# Patient Record
Sex: Male | Born: 1935 | Race: White | Hispanic: No | Marital: Single | Smoking: Never smoker
Health system: Southern US, Community
[De-identification: ages and names within clinical notes are randomized; demographics above are authoritative.]

## PROBLEM LIST (undated history)

## (undated) DIAGNOSIS — H5440 Blindness, one eye, unspecified eye: Secondary | ICD-10-CM

## (undated) DIAGNOSIS — J019 Acute sinusitis, unspecified: Secondary | ICD-10-CM

## (undated) DIAGNOSIS — F329 Major depressive disorder, single episode, unspecified: Secondary | ICD-10-CM

## (undated) DIAGNOSIS — R002 Palpitations: Secondary | ICD-10-CM

## (undated) DIAGNOSIS — I10 Essential (primary) hypertension: Secondary | ICD-10-CM

## (undated) DIAGNOSIS — R51 Headache: Secondary | ICD-10-CM

## (undated) DIAGNOSIS — D649 Anemia, unspecified: Secondary | ICD-10-CM

## (undated) DIAGNOSIS — Z85828 Personal history of other malignant neoplasm of skin: Secondary | ICD-10-CM

## (undated) DIAGNOSIS — C61 Malignant neoplasm of prostate: Secondary | ICD-10-CM

## (undated) DIAGNOSIS — K219 Gastro-esophageal reflux disease without esophagitis: Secondary | ICD-10-CM

## (undated) DIAGNOSIS — Z8546 Personal history of malignant neoplasm of prostate: Secondary | ICD-10-CM

## (undated) DIAGNOSIS — J309 Allergic rhinitis, unspecified: Secondary | ICD-10-CM

## (undated) DIAGNOSIS — R32 Unspecified urinary incontinence: Secondary | ICD-10-CM

## (undated) DIAGNOSIS — Z85118 Personal history of other malignant neoplasm of bronchus and lung: Secondary | ICD-10-CM

## (undated) DIAGNOSIS — K5730 Diverticulosis of large intestine without perforation or abscess without bleeding: Secondary | ICD-10-CM

## (undated) HISTORY — DX: Unspecified urinary incontinence: R32

## (undated) HISTORY — PX: TONSILLECTOMY: SHX5217

## (undated) HISTORY — DX: Gastro-esophageal reflux disease without esophagitis: K21.9

## (undated) HISTORY — DX: Headache: R51

## (undated) HISTORY — DX: Personal history of other malignant neoplasm of bronchus and lung: Z85.118

## (undated) HISTORY — DX: Anemia, unspecified: D64.9

## (undated) HISTORY — DX: Acute sinusitis, unspecified: J01.90

## (undated) HISTORY — DX: Diverticulosis of large intestine without perforation or abscess without bleeding: K57.30

## (undated) HISTORY — DX: Palpitations: R00.2

## (undated) HISTORY — DX: Allergic rhinitis, unspecified: J30.9

## (undated) HISTORY — DX: Personal history of other malignant neoplasm of skin: Z85.828

## (undated) HISTORY — DX: Blindness, one eye, unspecified eye: H54.40

## (undated) HISTORY — DX: Major depressive disorder, single episode, unspecified: F32.9

## (undated) HISTORY — DX: Essential (primary) hypertension: I10

## (undated) HISTORY — DX: Malignant neoplasm of prostate: C61

## (undated) HISTORY — DX: Personal history of malignant neoplasm of prostate: Z85.46

## (undated) HISTORY — PX: OTHER SURGICAL HISTORY: SHX169

---

## 2001-02-12 ENCOUNTER — Inpatient Hospital Stay (HOSPITAL_COMMUNITY): Admission: EM | Admit: 2001-02-12 | Discharge: 2001-02-13 | Payer: Self-pay | Admitting: Emergency Medicine

## 2001-02-12 ENCOUNTER — Encounter: Payer: Self-pay | Admitting: *Deleted

## 2001-02-13 ENCOUNTER — Encounter: Payer: Self-pay | Admitting: Cardiology

## 2001-12-16 ENCOUNTER — Encounter: Payer: Self-pay | Admitting: Emergency Medicine

## 2001-12-16 ENCOUNTER — Ambulatory Visit (HOSPITAL_COMMUNITY): Admission: RE | Admit: 2001-12-16 | Discharge: 2001-12-16 | Payer: Self-pay | Admitting: Emergency Medicine

## 2002-01-05 ENCOUNTER — Encounter: Payer: Self-pay | Admitting: Urology

## 2002-01-05 ENCOUNTER — Encounter: Admission: RE | Admit: 2002-01-05 | Discharge: 2002-01-05 | Payer: Self-pay | Admitting: Urology

## 2004-10-17 ENCOUNTER — Ambulatory Visit: Payer: Self-pay | Admitting: Internal Medicine

## 2006-08-11 ENCOUNTER — Ambulatory Visit: Payer: Self-pay | Admitting: Internal Medicine

## 2006-08-11 LAB — CONVERTED CEMR LAB
ALT: 15 units/L (ref 0–53)
AST: 22 units/L (ref 0–37)
Albumin: 4.2 g/dL (ref 3.5–5.2)
Alkaline Phosphatase: 51 units/L (ref 39–117)
BUN: 16 mg/dL (ref 6–23)
Basophils Absolute: 0 10*3/uL (ref 0.0–0.1)
Basophils Relative: 0.6 % (ref 0.0–1.0)
Bilirubin, Direct: 0.1 mg/dL (ref 0.0–0.3)
CO2: 30 meq/L (ref 19–32)
Calcium: 9.3 mg/dL (ref 8.4–10.5)
Chloride: 108 meq/L (ref 96–112)
Creatinine, Ser: 1.3 mg/dL (ref 0.4–1.5)
Eosinophils Absolute: 0.1 10*3/uL (ref 0.0–0.6)
Eosinophils Relative: 2.1 % (ref 0.0–5.0)
GFR calc Af Amer: 67 mL/min
GFR calc non Af Amer: 56 mL/min
Glucose, Bld: 104 mg/dL — ABNORMAL HIGH (ref 70–99)
HCT: 37.8 % — ABNORMAL LOW (ref 39.0–52.0)
Hemoglobin: 12.9 g/dL — ABNORMAL LOW (ref 13.0–17.0)
Lymphocytes Relative: 33 % (ref 12.0–46.0)
MCHC: 34 g/dL (ref 30.0–36.0)
MCV: 98 fL (ref 78.0–100.0)
Monocytes Absolute: 1 10*3/uL — ABNORMAL HIGH (ref 0.2–0.7)
Monocytes Relative: 15.6 % — ABNORMAL HIGH (ref 3.0–11.0)
Neutro Abs: 3 10*3/uL (ref 1.4–7.7)
Neutrophils Relative %: 48.7 % (ref 43.0–77.0)
PSA: 56.25 ng/mL — ABNORMAL HIGH (ref 0.10–4.00)
Platelets: 155 10*3/uL (ref 150–400)
Potassium: 5.1 meq/L (ref 3.5–5.1)
RBC: 3.86 M/uL — ABNORMAL LOW (ref 4.22–5.81)
RDW: 12.1 % (ref 11.5–14.6)
Sodium: 144 meq/L (ref 135–145)
TSH: 4.85 microintl units/mL (ref 0.35–5.50)
Total Bilirubin: 0.7 mg/dL (ref 0.3–1.2)
Total Protein: 6.8 g/dL (ref 6.0–8.3)
WBC: 6.1 10*3/uL (ref 4.5–10.5)

## 2006-08-12 DIAGNOSIS — I10 Essential (primary) hypertension: Secondary | ICD-10-CM | POA: Insufficient documentation

## 2006-08-12 DIAGNOSIS — J309 Allergic rhinitis, unspecified: Secondary | ICD-10-CM | POA: Insufficient documentation

## 2006-08-12 DIAGNOSIS — F329 Major depressive disorder, single episode, unspecified: Secondary | ICD-10-CM

## 2006-08-12 DIAGNOSIS — R51 Headache: Secondary | ICD-10-CM

## 2006-08-12 DIAGNOSIS — Z85118 Personal history of other malignant neoplasm of bronchus and lung: Secondary | ICD-10-CM

## 2006-08-12 DIAGNOSIS — R32 Unspecified urinary incontinence: Secondary | ICD-10-CM

## 2006-08-12 DIAGNOSIS — Z85828 Personal history of other malignant neoplasm of skin: Secondary | ICD-10-CM

## 2006-08-12 DIAGNOSIS — H5440 Blindness, one eye, unspecified eye: Secondary | ICD-10-CM

## 2006-08-12 DIAGNOSIS — F3289 Other specified depressive episodes: Secondary | ICD-10-CM

## 2006-08-12 DIAGNOSIS — R519 Headache, unspecified: Secondary | ICD-10-CM | POA: Insufficient documentation

## 2006-08-12 DIAGNOSIS — Z8546 Personal history of malignant neoplasm of prostate: Secondary | ICD-10-CM

## 2006-08-12 DIAGNOSIS — E785 Hyperlipidemia, unspecified: Secondary | ICD-10-CM

## 2006-08-12 DIAGNOSIS — K5730 Diverticulosis of large intestine without perforation or abscess without bleeding: Secondary | ICD-10-CM | POA: Insufficient documentation

## 2006-08-12 HISTORY — DX: Diverticulosis of large intestine without perforation or abscess without bleeding: K57.30

## 2006-08-12 HISTORY — DX: Other specified depressive episodes: F32.89

## 2006-08-12 HISTORY — DX: Allergic rhinitis, unspecified: J30.9

## 2006-08-12 HISTORY — DX: Personal history of other malignant neoplasm of skin: Z85.828

## 2006-08-12 HISTORY — DX: Personal history of malignant neoplasm of prostate: Z85.46

## 2006-08-12 HISTORY — DX: Major depressive disorder, single episode, unspecified: F32.9

## 2006-08-12 HISTORY — DX: Personal history of other malignant neoplasm of bronchus and lung: Z85.118

## 2006-08-12 HISTORY — DX: Essential (primary) hypertension: I10

## 2006-08-12 HISTORY — DX: Unspecified urinary incontinence: R32

## 2006-08-12 HISTORY — DX: Blindness, one eye, unspecified eye: H54.40

## 2006-08-12 HISTORY — DX: Headache: R51

## 2010-02-18 ENCOUNTER — Encounter: Payer: Self-pay | Admitting: Urology

## 2010-04-09 ENCOUNTER — Telehealth: Payer: Self-pay | Admitting: Internal Medicine

## 2010-04-10 ENCOUNTER — Other Ambulatory Visit: Payer: Self-pay | Admitting: Internal Medicine

## 2010-04-10 ENCOUNTER — Encounter: Payer: Self-pay | Admitting: Internal Medicine

## 2010-04-10 ENCOUNTER — Other Ambulatory Visit: Payer: Medicare PPO

## 2010-04-10 ENCOUNTER — Ambulatory Visit (INDEPENDENT_AMBULATORY_CARE_PROVIDER_SITE_OTHER): Payer: Medicare PPO | Admitting: Internal Medicine

## 2010-04-10 DIAGNOSIS — Z23 Encounter for immunization: Secondary | ICD-10-CM

## 2010-04-10 DIAGNOSIS — Z8546 Personal history of malignant neoplasm of prostate: Secondary | ICD-10-CM

## 2010-04-10 DIAGNOSIS — R002 Palpitations: Secondary | ICD-10-CM | POA: Insufficient documentation

## 2010-04-10 DIAGNOSIS — Z0000 Encounter for general adult medical examination without abnormal findings: Secondary | ICD-10-CM

## 2010-04-10 DIAGNOSIS — J019 Acute sinusitis, unspecified: Secondary | ICD-10-CM

## 2010-04-10 DIAGNOSIS — I10 Essential (primary) hypertension: Secondary | ICD-10-CM

## 2010-04-10 DIAGNOSIS — E785 Hyperlipidemia, unspecified: Secondary | ICD-10-CM

## 2010-04-10 DIAGNOSIS — K219 Gastro-esophageal reflux disease without esophagitis: Secondary | ICD-10-CM | POA: Insufficient documentation

## 2010-04-10 DIAGNOSIS — D649 Anemia, unspecified: Secondary | ICD-10-CM

## 2010-04-10 HISTORY — DX: Acute sinusitis, unspecified: J01.90

## 2010-04-10 HISTORY — DX: Palpitations: R00.2

## 2010-04-10 HISTORY — DX: Gastro-esophageal reflux disease without esophagitis: K21.9

## 2010-04-10 LAB — LIPID PANEL
LDL Cholesterol: 56 mg/dL (ref 0–99)
Total CHOL/HDL Ratio: 2
VLDL: 15 mg/dL (ref 0.0–40.0)

## 2010-04-10 LAB — CBC WITH DIFFERENTIAL/PLATELET
Basophils Absolute: 0 10*3/uL (ref 0.0–0.1)
Basophils Relative: 0.4 % (ref 0.0–3.0)
Eosinophils Absolute: 0.1 10*3/uL (ref 0.0–0.7)
Eosinophils Relative: 1.6 % (ref 0.0–5.0)
HCT: 33 % — ABNORMAL LOW (ref 39.0–52.0)
Hemoglobin: 11.6 g/dL — ABNORMAL LOW (ref 13.0–17.0)
Lymphocytes Relative: 26.7 % (ref 12.0–46.0)
Lymphs Abs: 1.6 10*3/uL (ref 0.7–4.0)
MCHC: 35 g/dL (ref 30.0–36.0)
MCV: 98.1 fl (ref 78.0–100.0)
Monocytes Absolute: 0.7 10*3/uL (ref 0.1–1.0)
Monocytes Relative: 11.7 % (ref 3.0–12.0)
Neutro Abs: 3.7 10*3/uL (ref 1.4–7.7)
Neutrophils Relative %: 59.6 % (ref 43.0–77.0)
Platelets: 138 10*3/uL — ABNORMAL LOW (ref 150.0–400.0)
RBC: 3.37 Mil/uL — ABNORMAL LOW (ref 4.22–5.81)
RDW: 14.1 % (ref 11.5–14.6)
WBC: 6.1 10*3/uL (ref 4.5–10.5)

## 2010-04-10 LAB — URINALYSIS
Bilirubin Urine: NEGATIVE
Hgb urine dipstick: NEGATIVE
Nitrite: NEGATIVE
Urobilinogen, UA: 0.2 (ref 0.0–1.0)

## 2010-04-10 LAB — BASIC METABOLIC PANEL
BUN: 24 mg/dL — ABNORMAL HIGH (ref 6–23)
CO2: 29 mEq/L (ref 19–32)
Calcium: 9.5 mg/dL (ref 8.4–10.5)
Chloride: 104 mEq/L (ref 96–112)
Creatinine, Ser: 1.6 mg/dL — ABNORMAL HIGH (ref 0.4–1.5)
GFR: 42.75 mL/min — ABNORMAL LOW (ref >60.00)
Glucose, Bld: 106 mg/dL — ABNORMAL HIGH (ref 70–99)
Potassium: 4.2 mEq/L (ref 3.5–5.1)
Sodium: 141 mEq/L (ref 135–145)

## 2010-04-10 LAB — HEPATIC FUNCTION PANEL
ALT: 15 U/L (ref 0–53)
AST: 20 U/L (ref 0–37)
Albumin: 4.2 g/dL (ref 3.5–5.2)
Alkaline Phosphatase: 71 U/L (ref 39–117)
Bilirubin, Direct: 0.1 mg/dL (ref 0.0–0.3)
Total Bilirubin: 0.6 mg/dL (ref 0.3–1.2)
Total Protein: 6.9 g/dL (ref 6.0–8.3)

## 2010-04-10 LAB — TSH: TSH: 6.02 u[IU]/mL — ABNORMAL HIGH (ref 0.35–5.50)

## 2010-04-10 LAB — PSA: PSA: >148 ng/mL — ABNORMAL HIGH (ref 0.10–4.00)

## 2010-04-11 ENCOUNTER — Encounter: Payer: Self-pay | Admitting: Internal Medicine

## 2010-04-11 DIAGNOSIS — C61 Malignant neoplasm of prostate: Secondary | ICD-10-CM | POA: Insufficient documentation

## 2010-04-11 HISTORY — DX: Malignant neoplasm of prostate: C61

## 2010-04-11 LAB — IBC PANEL: Iron: 76 ug/dL (ref 42–165)

## 2010-04-17 NOTE — Miscellaneous (Signed)
Summary: Orders Update   Clinical Lists Changes  Problems: Added new problem of PROSTATE CANCER, METASTATIC (ICD-185) Orders: Added new Referral order of Urology Referral (Urology) - Signed

## 2010-04-17 NOTE — Assessment & Plan Note (Signed)
Summary: kidney problem,heartburn/last ov 2008/cd   Vital Signs:  Patient profile:   77 year old male Height:      68 inches Weight:      132.25 pounds BMI:     20.18 O2 Sat:      97 % on Room air Temp:     98.3 degrees F oral Pulse rate:   60 / minute BP sitting:   162 / 90  (left arm) Cuff size:   regular  Vitals Entered By: Zella Ball Ewing CMA (AAMA) (April 10, 2010 2:08 PM)  O2 Flow:  Room air  Preventive Care Screening     declines tetanus and colonscopy  CC: Rash, over active bladder, labs and refills/RE   CC:  Rash, over active bladder, and labs and refills/RE.  History of Present Illness: here for welness and f/u after being lost to f/u for over 3 yrs;  incidetnly today with 3 dyas onset ST, facial pain, pressure, low grade temp and non prod cough;  Pt denies CP, worsening sob, doe, wheezing, orthopnea, pnd, worsening LE edema, palps, dizziness or syncope  Pt denies new neuro symptoms such as headache, facial or extremity weakness  Pt denies polydipsia, polyuria  Overall good compliance with meds, trying to follow low chol  diet, wt stable, little excercise however   No recent wt loss, night sweats, loss of appetite or other constitutional symptoms . Overall good compliance with meds, and good tolerability.  Denies worsening depressive symptoms, suicidal ideation, or panic, though ha had significant anxiety in the past , and has chronic mild low grade per pt.  Pt states good ability with ADL's, low fall risk, home safety reviewed and adequate, no significant change in hearing or vision, trying to follow lower chol diet, and occasionally active only with regular excercise.  Does have some trouble with some urinary oincontinence,  has known hx of prostate ca.  [employer] veteran - does not go to [place] recently.      Does have ongoing midl reflux worse recently in the past 2 mo;  no dysphagia, abd pain, vomiting or blood.   Preventive Screening-Counseling & Management  Alcohol-Tobacco  Smoking Status: never      Drug Use:  no.    Problems Prior to Update: 1)  Diverticulosis, Colon  (ICD-562.10) 2)  Blindness, Left Eye  (ICD-369.60) 3)  Skin Cancer, Hx of  (ICD-V10.83) 4)  Allergic Rhinitis  (ICD-477.9) 5)  Urinary Incontinence  (ICD-788.30) 6)  Hypertension  (ICD-401.9) 7)  Hyperlipidemia  (ICD-272.4) 8)  Headache  (ICD-784.0) 9)  Depression  (ICD-311) 10)  Prostate Cancer, Hx of  (ICD-V10.46) 11)  Lung Cancer, Hx of  (ICD-V10.11)  Medications Prior to Update: 1)  Metoprolol Tartrate 50 Mg  Tabs (Metoprolol Tartrate) .... Take 1/2 Two Times A Day  Need Appt For Addtional Refils 2)  Simvastatin 40 Mg  Tabs (Simvastatin) .... Take 1 By Mouth Once Daily Need Appt For Addtional Refills 3)  Enalapril Maleate 2.5 Mg  Tabs (Enalapril Maleate) .... Take 1 By Mouth Once Daily Need Follow-Up Appt For Addtional Refills  Current Medications (verified): 1)  Metoprolol Tartrate 50 Mg  Tabs (Metoprolol Tartrate) .... Take 1/2 Two Times A Day 2)  Simvastatin 40 Mg  Tabs (Simvastatin) .... Take 1 By Mouth Once Daily 3)  Enalapril Maleate 2.5 Mg  Tabs (Enalapril Maleate) .... Take 1 By Mouth Once Daily 4)  Amoxicillin 500 Mg Caps (Amoxicillin) .... 2 By Mouth Two Times A Day 5)  Omeprazole 20 Mg Cpdr (Omeprazole) .Marland Kitchen.. 1po Once Daily 6)  Citalopram Hydrobromide 10 Mg Tabs (Citalopram Hydrobromide) .Marland Kitchen.. 1po Once Daily  Allergies (verified): 1)  ! Sulfa  Past History:  Past Surgical History: Last updated: 08/12/2006 Skin Cancer removal- Shoulder Cataract extraction- right eye Tonsillectomy  Family History: Last updated: 04/10/2010 cancer - mother with lung cancer  Risk Factors: Smoking Status: never (04/10/2010)  Past Medical History: Lung cancer, hx of Prostate cancer, hx of Depression Headache Hyperlipidemia Hypertension Urinary incontinence Allergic rhinitis Skin cancer, hx of Blind in L eye Diverticulosis, colon palpitations GERD  Family  History: Reviewed history and no changes required. cancer - mother with lung cancer  Social History: Reviewed history and no changes required. 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  The patient denies anorexia, fever, vision loss, hoarseness, chest pain, syncope, dyspnea on exertion, peripheral edema, prolonged cough, headaches, hemoptysis, abdominal pain, melena, hematochezia, severe indigestion/heartburn, hematuria, muscle weakness, suspicious skin lesions, transient blindness, difficulty walking, unusual weight change, abnormal bleeding, enlarged lymph nodes, and angioedema.         all otherwise negative per pt -  sees urology for prostate cancer, last visit 6 mo, alo has ongoing hearing loss  Physical Exam  General:  alert and underweight appearing. , elderly , frail but not confused, but has some hearing loss Head:  normocephalic and atraumatic.   Eyes:  vision grossly intact, pupils equal, and pupils round.   Ears:  R ear normal.  , left tm mod erythema, sinus tender bilat Nose:  nasal dischargemucosal pallor and mucosal edema.   Mouth:  pharyngeal erythema and fair dentition.   Neck:  supple and no masses.   Lungs:  normal respiratory effort and normal breath sounds.   Heart:  normal rate and regular rhythm.   Abdomen:  soft, non-tender, and normal bowel sounds.   Msk:  no joint tenderness and no joint swelling.   Extremities:  no edema, no erythema  Neurologic:  strength normal in all extremities and gait normal though frail and elderly Skin:  color normal and no rashes.   Psych:  depressed affect and slightly anxious.     Impression & Recommendations:  Problem # 1:  Preventive Health Care (ICD-V70.0) Overall doing well, age appropriate education and counseling updated, referral for  preventive services and immunizations addressed, dietary counseling and smoking status adressed , most recent labs reviewed, ecg reviewed I have personally reviewed and have noted 1.The patient's medical and social history 2.Their use of alcohol, tobacco or illicit drugs 3.Their current medications and supplements 4. Functional ability including ADL's, fall risk, home safety risk, hearing & visual impairment  5.Diet and physical activities 6.Evidence for depression or mood disorders The patients weight, height, BMI  have been recorded in the chart I have made referrals, counseling and provided education to the patient based review of the above  Orders: EKG w/ Interpretation (93000) TLB-BMP (Basic Metabolic Panel-BMET) (80048-METABOL) TLB-CBC Platelet - w/Differential (85025-CBCD) TLB-Hepatic/Liver Function Pnl (80076-HEPATIC) TLB-Lipid Panel (80061-LIPID) TLB-TSH (Thyroid Stimulating Hormone) (84443-TSH) TLB-Udip ONLY (81003-UDIP) EKG w/ Interpretation (93000)  Problem # 2:  SINUSITIS- ACUTE-NOS (ICD-461.9)  His updated medication list for this problem includes:    Amoxicillin 500 Mg Caps (Amoxicillin) .Marland Kitchen... 2 by mouth two times a day treat as above, f/u  any worsening signs or symptoms   Instructed on treatment. Call if symptoms persist or worsen.   Problem # 3:  HYPERTENSION (ICD-401.9)  His updated medication list for this problem includes:    Metoprolol Tartrate 50 Mg Tabs (Metoprolol tartrate) .Marland Kitchen... Take 1/2 two times a day    Enalapril Maleate 2.5 Mg Tabs (Enalapril maleate) .Marland Kitchen... Take 1 by mouth once daily  BP today: 162/90  Labs Reviewed: K+: 5.1 (08/11/2006) Creat: : 1.3 (08/11/2006)    to re-start meds - treat as above, f/u any worsening signs or symptoms   Problem # 4:  HYPERLIPIDEMIA (ICD-272.4)  His updated medication list for this problem includes:    Simvastatin 40 Mg Tabs (Simvastatin) .Marland Kitchen... Take 1 by mouth once daily  Labs Reviewed: SGOT: 22 (08/11/2006)    SGPT: 15 (08/11/2006) to re-start med - treat as above, f/u any worsening signs or symptoms   Problem # 5:  DEPRESSION (ICD-311)  His updated medication list for this problem includes:    Citalopram Hydrobromide 10 Mg Tabs (Citalopram hydrobromide) .Marland Kitchen... 1po once daily  Orders: TLB-B12 + Folate Pnl (82746_82607-B12/FOL) treat as above, f/u any worsening signs or symptoms  - new med for him  Problem # 6:  PALPITATIONS, RECURRENT (ICD-785.1)  His updated medication list for this problem includes:    Metoprolol Tartrate 50 Mg Tabs (Metoprolol tartrate) .Marland Kitchen... Take 1/2 two times a day treat as above, f/u any worsening signs or symptoms (chronic, ongoing, but out of med for > 1 yr)  Avoid caffeine, chocolate, and stimulants. Stress reduction as well as medication options discussed.   Problem # 7:  GERD (ICD-530.81)  His updated medication list for this problem includes:    Omeprazole 20 Mg Cpdr (Omeprazole) .Marland Kitchen... 1po once daily  Labs Reviewed: Hgb: 12.9 (08/11/2006)   Hct: 37.8 (08/11/2006) treat as above, f/u any worsening signs or symptoms , also for antireflux precautions  Complete Medication List: 1)  Metoprolol Tartrate 50 Mg Tabs (Metoprolol tartrate) .... Take 1/2 two times a day 2)  Simvastatin 40 Mg Tabs (Simvastatin) .... Take 1 by mouth once daily 3)  Enalapril Maleate 2.5 Mg Tabs (Enalapril maleate) .... Take 1 by mouth once daily 4)  Amoxicillin 500 Mg Caps (Amoxicillin) .... 2 by mouth two times a day 5)  Omeprazole 20 Mg Cpdr (Omeprazole) .Marland Kitchen.. 1po once daily 6)  Citalopram Hydrobromide 10 Mg Tabs (Citalopram hydrobromide) .Marland Kitchen.. 1po once daily  Other Orders: TLB-PSA (Prostate Specific Antigen) (84153-PSA)  Patient Instructions: 1)  you had the pneumonia shot today 2)  Please take all new medications as prescribed 3)  Continue all previous medications as before this visit  4)  Please go to the Lab in the basement for your blood and/or urine tests today  5)  Please  call the number on the [facility] for results of your testing  6)  Please schedule a follow-up appointment in 1 months, or sooner if needed 7)  Please keep your regular followup with your urologist as you do Prescriptions: CITALOPRAM HYDROBROMIDE 10 MG TABS (CITALOPRAM HYDROBROMIDE) 1po once daily  #90 x 3   Entered and Authorized by:   Corwin Levins MD   Signed by:   Corwin Levins MD on 04/10/2010   Method used:   Electronically to        [facility] [phone]* (retail)       [address] [place], [place]  [address]  Ph: [phone] or [phone]       Fax: [phone]   RxID:   [id number] OMEPRAZOLE 20 MG CPDR (OMEPRAZOLE) 1po once daily  #90 x 3   Entered and Authorized by:   Corwin Levins MD   Signed by:   Corwin Levins MD on 04/10/2010   Method used:   Electronically to        [facility] [phone]* (retail)       [address] [place], [place]  [address]       Ph: [phone] or [phone]       Fax: [phone]   RxID:   [id number] METOPROLOL TARTRATE 50 MG  TABS (METOPROLOL TARTRATE) take 1/2 two times a day  #90 x 3   Entered and Authorized by:   Corwin Levins MD   Signed by:   Corwin Levins MD on 04/10/2010   Method used:   Electronically to        [facility] [phone]* (retail)       [address] [place], [place]  [address]       Ph: [phone] or [phone]       Fax: [phone]   RxID:   [id number] SIMVASTATIN 40 MG  TABS (SIMVASTATIN) take 1 by mouth once daily  #90 x 3   Entered and Authorized by:   Corwin Levins MD   Signed by:   Corwin Levins MD on 04/10/2010   Method used:   Electronically to        [facility] [phone]* (retail)       [address] [place], [place]  [address]       Ph: [phone] or [phone]       Fax: [phone]   RxID:   [id number] ENALAPRIL MALEATE 2.5 MG  TABS (ENALAPRIL MALEATE) take 1 by mouth once daily  #90 x 3   Entered and Authorized by:   Corwin Levins MD    Signed by:   Corwin Levins MD on 04/10/2010   Method used:   Electronically to        [facility] [phone]* (retail)       [address] [place], [place]  [address]       Ph: [phone] or [phone]       Fax: [phone]   RxID:   [id number] AMOXICILLIN 500 MG CAPS (AMOXICILLIN) 2 by mouth two times a day  #40 x 0   Entered and Authorized by:   Corwin Levins MD   Signed by:   Corwin Levins MD on 04/10/2010   Method used:   Electronically to        [facility] [phone]* (retail)       [address] [place], [place]  [address]       Ph: [phone] or [phone]       Fax: [phone]   RxID:   [id number] AMOXICILLIN 500 MG CAPS (AMOXICILLIN) 2 by mouth two times a day  #40 x 0   Entered and Authorized by:   Corwin Levins MD   Signed by:   Corwin Levins MD on 04/10/2010   Method used:  Print then Give to Patient   RxID:   [id number]    Orders Added: 1)  EKG w/ Interpretation [93000] 2)  TLB-BMP (Basic Metabolic Panel-BMET) [80048-METABOL] 3)  TLB-CBC Platelet - w/Differential [85025-CBCD] 4)  TLB-Hepatic/Liver Function Pnl [80076-HEPATIC] 5)  TLB-Lipid Panel [80061-LIPID] 6)  TLB-TSH (Thyroid Stimulating Hormone) [84443-TSH] 7)  TLB-Udip ONLY [81003-UDIP] 8)  TLB-PSA (Prostate Specific Antigen) [84153-PSA] 9)  TLB-B12 + Folate Pnl [82746_82607-B12/FOL] 10)  EKG w/ Interpretation [93000]  Appended Document: Immunization Entry      Immunizations Administered:  Pneumonia Vaccine:    Vaccine Type: Pneumovax    Site: right deltoid    Mfr: Merck    Dose: 0.5 ml    Route: IM    Given by: Zella Ball Ewing CMA (AAMA)    Exp. Date: 06/09/2011    Lot #: [id number]    VIS given: 01/02/09 version given April 10, 2010.

## 2010-04-17 NOTE — Progress Notes (Signed)
  Phone Note Call from Patient Call back at Home Phone [phone]   Caller: [phone] Call For: Dr Jonny Ruiz Summary of Call: Pt last seen 2008 compaining of frequent urination, and artery over heart feels sore. I put on Dr Jonny Ruiz schedule for [facility]. Pt requests work in appt today, we have no 30 min slot avail w/Dr Karne Ozga--please advise. Initial call taken by: Verdell Face,  April 09, 2010 9:23 AM  Follow-up for Phone Call        note  he is now a new pt (> 3 yrs since last seen)  he should go to urgent care or ER today/now with these symptoms Follow-up by: Corwin Levins MD,  April 09, 2010 12:11 PM  Additional Follow-up for Phone Call Additional follow up Details #1::        Pt advised of above but states that he is not having CP but the arteries near his heart are "sore because of the cholesterol". Pt declined to go to UC or ER but was advised to go if sxs worsen. Pt agreed. Additional Follow-up by: Margaret Pyle, CMA,  April 09, 2010 2:12 PM

## 2010-05-14 ENCOUNTER — Ambulatory Visit: Payer: Medicare PPO | Admitting: Internal Medicine

## 2010-05-15 ENCOUNTER — Other Ambulatory Visit: Payer: Self-pay

## 2010-05-15 MED ORDER — AZITHROMYCIN 250 MG PO TABS: ORAL_TABLET | ORAL | Status: AC

## 2010-05-15 NOTE — Telephone Encounter (Signed)
Pt advised of Rx and pharmacy 

## 2010-05-15 NOTE — Telephone Encounter (Signed)
Done per emr 

## 2010-05-15 NOTE — Telephone Encounter (Signed)
Pt called stating that he is still having ear pain. Pt is requesting an ABX

## 2010-06-15 NOTE — Discharge Summary (Signed)
[place]. [facility]  Patient:    Warren Christensen, Warren Christensen Visit Number: [id number] MRN: [id number]          Service Type: MED Location: 2000 2003 01 Attending Physician:  Rollene Rotunda Dictated by:   Lavella Hammock, P.A.-C. Admit Date:  02/12/2001 Disc. Date: 02/13/01   CC:         [facility], [address] [place], [place]   Referring Physician Discharge [place]  DATE OF BIRTH:  05-07-1918  PROCEDURES:  CT scan of the chest.  HOSPITAL COURSE:  [name] is an 77 year old male with no known history of coronary artery disease, who was seem in [facility] on the day of admission for chest pain.  His EKG was abnormal, and the patient was transported to [facility] for evaluation.  He described the pain as an ache in his upper left chest and at worst with a 5/10.  Normally he takes some Lopressor that was filled in 1995 and a Bufferin and walks and obtains relief but on the night before admission, he had pain all night, and he was 3/10 upon exam.  The pain is worse with certain movements.  He denies shortness of breath and diaphoresis, although he had some nausea the night before admission.  He has not seen a doctor in four years.  He has seen a physician for skin cancer removal on his right neck and cataract surgery but otherwise has had no medical evaluation.  He was admitted to rule out MI and for further evaluation.  His enzymes were negative for MI, and his chest x-ray had no acute change.  He had a D-dimer checked, and that was elevated at 1.92, so he had a CT scan of his chest.  The CT scan of his chest showed a nodular density in the lingular area that was most likely a scar, but follow-up CT in 3-4 months was recommended.  The next day, the patient was symptom-free.  He was ambulating without difficulty.  His platelets were slightly decreased at 116, but they had been normal upon initial evaluation, and it was felt that this was  secondary to heparin.  With no further chest pain and a negative CT, he was considered stable for discharge on February 13, 2001.  CHEST X-RAY:  No active cardiopulmonary disease radiographically apparent. Mild tortuosity of the thoracic aorta.  LABORATORY VALUES:  Hemoglobin 11.6, hematocrit 33.4, WBC 6.7, platelets 116.  Sodium 142, potassium 4.3, chloride 106, CO2 30, BUN 12, creatinine 1.2, glucose 84.  Serial CK-MB and troponin I negative for MI.  Total cholesterol 129, triglycerides 82, HDL 52, LDL 61.  TSH 4.526.  DISCHARGE INSTRUCTIONS: 1. No strenuous activity until seeing M.D. 2. He is to stick to a low fat diet. 3. He is to get a CBC at his office visit. 4. He is to get a follow-up CT of the chest in three months. 5. He is to get a stress test on February 19, 2001, at 10 a.m. and check in on    the [facility] on the second floor at 9:45. 6. He is not to eat or drink after midnight before the test, and he is not to    take his Toprol on February 19, 2001. 7. He to follow up with [facility] for primary care, and he is to    follow up with Dr. Antoine Poche on February 19, 2001, at 3:15 after the stress    test.  DISCHARGE MEDICATIONS: 1. Coated aspirin 325 mg q.d. 2. Toprol XL 25 mg q.d. 3. Enalapril 2.5 mg b.i.d. 4. Nitroglycerin 0.4 mg sublingual p.r.n. Dictated by:   Lavella Hammock, P.A.-C. Attending Physician:  Rollene Rotunda DD:  02/13/01 TD:  02/13/01 Job: [id number]

## 2010-06-15 NOTE — H&P (Signed)
[place]. [facility]  Patient:    Warren Christensen, Warren Christensen Visit Number: [id number] MRN: [id number]          Service Type: Attending:  Rollene Rotunda, M.D. [facility] Dictated by:   Rollene Rotunda, M.D. [facility] Adm. Date:  02/12/01                           History and Physical  PRIMARY:  None.  REFERRING:  [facility].  REASON FOR PRESENTATION:  Evaluate patient with chest pain and an abnormal EKG.  HISTORY OF PRESENT ILLNESS:  The patient is a pleasant 77 year old gentleman with no prior cardiac history.  He reports chest pain.  This has gone on and off for years, however, it has been more persistent in the last two weeks.  He describes left-sided discomfort under his left nipple.  Last night it was at its worst and was 7/10 in intensity.  He might have been slightly nauseated. He was also dizzy.  He has gotten this way before when he has been hypertensive.  He took Lopressor and Bufferin.  The pain when away at approximately 2 a.m. and he went to sleep.  However, when is awoke this a.m. he had continued discomfort.  He does not have radiation to his neck or to his arms.  This morning he was not having any diaphoresis, shortness of breath. He has no PND or orthopnea.  He does notice that the pain has been worse with certain movements such as twisting in a certain direction.  He described it as almost a toothache-type discomfort.  He did get here to the emergency room where his pain has not been relieved with IV nitroglycerin and heparin.  In [facility] he was noted to have an EKG with poor anterior R wave progression with no baseline with comparison and so is referred to [place].  PAST MEDICAL HISTORY:  Hypertension x years.  He has no history of diabetes or hyperlipidemia (although his lipid status is not really known).  PAST SURGICAL HISTORY:  Skin cancer removed from his right neck, cataract surgery, tonsillectomy.  ALLERGIES:   None.  MEDICATIONS: 1. Lopressor 50 mg q.d. (I think he takes this on a p.r.n. basis). 2. Niacin over-the-counter. 3. Pepto-Bismol.  SOCIAL HISTORY:  The patient lives alone.  He does not smoke.  He does not drink alcohol.  He is a retired Cytogeneticist.  He walks quite a bit.  FAMILY HISTORY:  Noncontributory for early coronary artery disease.  REVIEW OF SYSTEMS:  Positive for occasional dizzy spells which he describes with his hypertension.  Gastroesophageal reflux disease, mild trouble swallowing at times, frequent urination.  Otherwise, his review of systems is completely negative for all other systems.  PHYSICAL EXAMINATION:  GENERAL:  The patient is in no distress.  VITAL SIGNS:  Blood pressure 167/110, heart rate 58 and regular, afebrile.  HEENT: Unremarkable.  Pupils equal, round and reactive to light.  Fundi not visualized.  Oral mucosa unremarkable.  NECK:  No jugular venous distention.  Wave form within normal limits.  Carotid upstroke brisk and symmetric.  No bruits, thyromegaly.  LYMPHATICS:  No cervical, axillary, inguinal adenopathy.  LUNGS:  Clear to auscultation bilaterally.  BACK:  No costovertebral angle tenderness.  CHEST:  Unremarkable, no tenderness.  HEART:  PMI not displaced or sustained.  S1 and S2 within normal limits, no S3, positive S4, no murmurs.  ABDOMEN: Flat, positive bowel sounds.  Normal in frequency and pitch.  No bruits, rebound, guarding, midline pulse.  No mass or organomegaly.  SKIN:  No rashes, ___ .  EXTREMITIES:  Pulses 2+ throughout.  No edema, no bruits, no cyanosis, no clubbing.  NEURO:  Oriented to person, place and time, cranial nerves II through XII grossly intact, motor grossly intact.  CHEST X-RAY:  No acute disease.  EKG:  Sinus rhythm, rate 58, axis within normal limits, intervals within normal limits, poor anterior R wave progression V1 and V2, no old EKGs for comparison.  LABORATORY DATA:  Hemoglobin 13.3,  WBC 5.0, platelets 158, INR 1.1, CK 187, MB 1.7, troponin 0.07.  ASSESSMENT AND PLAN:  1. CHEST PAIN:  The patients chest pain is very atypical.  It was with movement. He has no objective evidence of ischemia.  He does have longstanding hypertension as a risk factor.  He has a really unknown lipid status.  At this point he will be admitted for rule out myocardial infarction.  I will plan an exercise Cardiolite to further screen for coronary disease. 2. HYPERTENSION:  The patients blood pressure is elevated.  He is taking the beta blocker.  The think he would tolerate a low dose of this (he is bradycardic).  In addition, I agree with an ACE inhibitor.  I would probably maximize these medicines before starting any other agent. 3. RISK FACTORS:  Will check a lipid. 4. OTHER:  I will screen with a D-dimer, although I have a very low suspicion for pulmonary embolus.  If his D-dimer is negative, I would not pursue further pulmonary embolus workup.  He has no real risk factors for this.  He has no evidence of chronic obstructive respiratory disease or tenderness. Dictated by:   Rollene Rotunda, M.D. [facility] Attending:  Rollene Rotunda, M.D. [facility] DD:  02/12/01 TD:  02/12/01 Job: [id number]

## 2010-11-15 ENCOUNTER — Emergency Department (HOSPITAL_COMMUNITY): Payer: Medicare PPO

## 2010-11-15 ENCOUNTER — Emergency Department (HOSPITAL_COMMUNITY)
Admission: EM | Admit: 2010-11-15 | Discharge: 2010-11-15 | Discharge status: Against Medical Advice | Payer: Medicare PPO | Attending: Emergency Medicine | Admitting: Emergency Medicine

## 2010-11-15 DIAGNOSIS — H543 Unqualified visual loss, both eyes: Secondary | ICD-10-CM | POA: Insufficient documentation

## 2010-11-15 DIAGNOSIS — Z79899 Other long term (current) drug therapy: Secondary | ICD-10-CM | POA: Insufficient documentation

## 2010-11-15 DIAGNOSIS — Z8546 Personal history of malignant neoplasm of prostate: Secondary | ICD-10-CM | POA: Insufficient documentation

## 2010-11-15 DIAGNOSIS — I10 Essential (primary) hypertension: Secondary | ICD-10-CM | POA: Insufficient documentation

## 2010-11-15 DIAGNOSIS — E785 Hyperlipidemia, unspecified: Secondary | ICD-10-CM | POA: Insufficient documentation

## 2010-11-15 DIAGNOSIS — F329 Major depressive disorder, single episode, unspecified: Secondary | ICD-10-CM | POA: Insufficient documentation

## 2010-11-15 DIAGNOSIS — R0789 Other chest pain: Secondary | ICD-10-CM | POA: Insufficient documentation

## 2010-11-15 DIAGNOSIS — K5730 Diverticulosis of large intestine without perforation or abscess without bleeding: Secondary | ICD-10-CM | POA: Insufficient documentation

## 2010-11-15 DIAGNOSIS — K219 Gastro-esophageal reflux disease without esophagitis: Secondary | ICD-10-CM | POA: Insufficient documentation

## 2010-11-15 DIAGNOSIS — F3289 Other specified depressive episodes: Secondary | ICD-10-CM | POA: Insufficient documentation

## 2010-11-15 DIAGNOSIS — Z85828 Personal history of other malignant neoplasm of skin: Secondary | ICD-10-CM | POA: Insufficient documentation

## 2010-11-15 LAB — COMPREHENSIVE METABOLIC PANEL
ALT: 16 U/L (ref 0–53)
AST: 25 U/L (ref 0–37)
Albumin: 3.5 g/dL (ref 3.5–5.2)
CO2: 26 mEq/L (ref 19–32)
Chloride: 104 mEq/L (ref 96–112)
Creatinine, Ser: 1.28 mg/dL (ref 0.50–1.35)
GFR calc non Af Amer: 48 mL/min — ABNORMAL LOW (ref >90)
Potassium: 4.5 mEq/L (ref 3.5–5.1)
Sodium: 138 mEq/L (ref 135–145)
Total Bilirubin: 0.2 mg/dL — ABNORMAL LOW (ref 0.3–1.2)

## 2010-11-15 LAB — CBC
Hemoglobin: 9.9 g/dL — ABNORMAL LOW (ref 13.0–17.0)
Platelets: 164 10*3/uL (ref 150–400)
RBC: 3.18 MIL/uL — ABNORMAL LOW (ref 4.22–5.81)
WBC: 5 10*3/uL (ref 4.0–10.5)

## 2010-11-15 LAB — DIFFERENTIAL
Basophils Absolute: 0 10*3/uL (ref 0.0–0.1)
Basophils Relative: 0 % (ref 0–1)
Eosinophils Absolute: 0.1 10*3/uL (ref 0.0–0.7)
Neutro Abs: 3.1 10*3/uL (ref 1.7–7.7)
Neutrophils Relative %: 61 % (ref 43–77)

## 2010-11-15 LAB — POCT I-STAT TROPONIN I

## 2010-11-15 LAB — URINALYSIS, ROUTINE W REFLEX MICROSCOPIC
Glucose, UA: NEGATIVE mg/dL
Ketones, ur: NEGATIVE mg/dL
Leukocytes, UA: NEGATIVE
Nitrite: NEGATIVE
Specific Gravity, Urine: 1.005 (ref 1.005–1.030)
pH: 6 (ref 5.0–8.0)

## 2010-11-15 LAB — D-DIMER, QUANTITATIVE: D-Dimer, Quant: 3.45 ug/mL-FEU — ABNORMAL HIGH (ref 0.00–0.48)

## 2010-11-15 MED ORDER — IOHEXOL 300 MG/ML  SOLN
80.0000 mL | Freq: Once | INTRAMUSCULAR | Status: AC | PRN
  Administered 2010-11-15: 80 mL via INTRAVENOUS

## 2010-11-16 ENCOUNTER — Encounter: Payer: Self-pay | Admitting: Internal Medicine

## 2010-11-16 ENCOUNTER — Ambulatory Visit: Payer: Medicare PPO | Admitting: Internal Medicine

## 2010-11-16 LAB — URINE CULTURE
Colony Count: NO GROWTH
Culture  Setup Time: 201210181506
Culture: NO GROWTH

## 2010-11-17 ENCOUNTER — Encounter: Payer: Self-pay | Admitting: Internal Medicine

## 2010-11-17 DIAGNOSIS — Z0000 Encounter for general adult medical examination without abnormal findings: Secondary | ICD-10-CM | POA: Insufficient documentation

## 2010-11-19 ENCOUNTER — Ambulatory Visit: Payer: Medicare PPO | Admitting: Internal Medicine

## 2010-11-20 ENCOUNTER — Other Ambulatory Visit (INDEPENDENT_AMBULATORY_CARE_PROVIDER_SITE_OTHER): Payer: Medicare PPO

## 2010-11-20 ENCOUNTER — Encounter: Payer: Self-pay | Admitting: Internal Medicine

## 2010-11-20 ENCOUNTER — Telehealth: Payer: Self-pay | Admitting: Internal Medicine

## 2010-11-20 ENCOUNTER — Ambulatory Visit (INDEPENDENT_AMBULATORY_CARE_PROVIDER_SITE_OTHER): Payer: Medicare PPO | Admitting: Internal Medicine

## 2010-11-20 VITALS — BP 120/80 | HR 70 | Temp 98.0°F | Ht 68.0 in | Wt 126.2 lb

## 2010-11-20 DIAGNOSIS — D649 Anemia, unspecified: Secondary | ICD-10-CM | POA: Insufficient documentation

## 2010-11-20 DIAGNOSIS — R5383 Other fatigue: Secondary | ICD-10-CM | POA: Insufficient documentation

## 2010-11-20 DIAGNOSIS — D509 Iron deficiency anemia, unspecified: Secondary | ICD-10-CM

## 2010-11-20 DIAGNOSIS — E785 Hyperlipidemia, unspecified: Secondary | ICD-10-CM

## 2010-11-20 DIAGNOSIS — C61 Malignant neoplasm of prostate: Secondary | ICD-10-CM

## 2010-11-20 DIAGNOSIS — H9190 Unspecified hearing loss, unspecified ear: Secondary | ICD-10-CM | POA: Insufficient documentation

## 2010-11-20 DIAGNOSIS — I10 Essential (primary) hypertension: Secondary | ICD-10-CM

## 2010-11-20 DIAGNOSIS — Z79899 Other long term (current) drug therapy: Secondary | ICD-10-CM

## 2010-11-20 HISTORY — DX: Anemia, unspecified: D64.9

## 2010-11-20 LAB — CBC WITH DIFFERENTIAL/PLATELET
Basophils Absolute: 0 10*3/uL (ref 0.0–0.1)
HCT: 28.4 % — ABNORMAL LOW (ref 39.0–52.0)
Lymphs Abs: 1.3 10*3/uL (ref 0.7–4.0)
MCV: 95.9 fl (ref 78.0–100.0)
Monocytes Absolute: 0.8 10*3/uL (ref 0.1–1.0)
Monocytes Relative: 12.2 % — ABNORMAL HIGH (ref 3.0–12.0)
Neutrophils Relative %: 65.8 % (ref 43.0–77.0)
Platelets: 170 10*3/uL (ref 150.0–400.0)
RDW: 14.8 % — ABNORMAL HIGH (ref 11.5–14.6)

## 2010-11-20 LAB — IBC PANEL: Iron: 17 ug/dL — ABNORMAL LOW (ref 42–165)

## 2010-11-20 LAB — URINALYSIS, ROUTINE W REFLEX MICROSCOPIC
Bilirubin Urine: NEGATIVE
Ketones, ur: NEGATIVE
Total Protein, Urine: NEGATIVE
pH: 6 (ref 5.0–8.0)

## 2010-11-20 LAB — PSA: PSA: >154 ng/mL — ABNORMAL HIGH (ref 0.10–4.00)

## 2010-11-20 LAB — TSH: TSH: 5.15 u[IU]/mL (ref 0.35–5.50)

## 2010-11-20 LAB — VITAMIN B12: Vitamin B-12: 529 pg/mL (ref 211–911)

## 2010-11-20 MED ORDER — FERROUS SULFATE 325 (65 FE) MG PO TABS: 325.0000 mg | ORAL_TABLET | Freq: Every day | ORAL | Status: DC

## 2010-11-20 NOTE — Assessment & Plan Note (Addendum)
Lost to f/u with urology - will ask pt to return, to check PSA today  NOTE:  Due to pt age and hearing loss , total time for hx, exam, d/w pt of results, review of recent ER records, determination of plan with pt and irrigaiton total face to face time > 40 min, total > 50 min with documentation and arranging referral

## 2010-11-20 NOTE — Assessment & Plan Note (Signed)
With general weakness and met prostate ca, not clear how much further he will benefit from statin tx - ok to d/c

## 2010-11-20 NOTE — Assessment & Plan Note (Signed)
Improved with bilat irrigation -  to f/u any worsening symptoms or concerns

## 2010-11-20 NOTE — Patient Instructions (Signed)
Your ears were irrigated of wax today on both sides OK to stop the simvastatin in case it is making the general weakness worse Please go to LAB in the Basement for the blood and/or urine tests to be done today Please call the phone number [phone] (the [employer]Tree System) for results of testing in 2-3 days;  When calling, simply dial the number, and when prompted enter the MRN number above (the Medical Record Number) and the # key, then the message should start. Continue all other medications as before You can also take OTC allegra for the allergies You will be contacted regarding the referral for: urology Please return in 1 month, or sooner if needed (such as if you have any bleeding)

## 2010-11-20 NOTE — Assessment & Plan Note (Signed)
With recent wt loss, stable overall by hx and exam, most recent data reviewed with pt, and pt to continue medical treatment as before  BP Readings from Last 3 Encounters:  11/20/10 120/80  04/10/10 162/90

## 2010-11-20 NOTE — Telephone Encounter (Signed)
Pt referred to GI for f/u iron def anemia, to start iron sulfate 325 qd

## 2010-11-20 NOTE — Assessment & Plan Note (Signed)
Multifactorial including age and met cancer, for TSH as well

## 2010-11-20 NOTE — Progress Notes (Signed)
Subjective:    Patient ID: Warren Christensen, male    DOB: 12-09-1918, 77 y.o.   MRN: [id number]  HPI  Here to f/u; just seen in ER oct 18, CT angio neg for PE, but did have worsening anemia, and ? Osseous lesions on CT ? Relating to  metastatic prostate cancer (was not known to be met in the past per pt, was seeing Dr Humphries/urology who has left the practice but has been approx over 2 yrs);  No overt bleeding or bruising.  C/o general weakness progressing in the past 6 mo, did fall in the [place] approx 5-6 mo ago to left chest but did not seek attention, and recent CXR oct 18 with mult rib fx healed. No falls since then.  Pt denies chest pain, increased sob or doe, wheezing, orthopnea, PND, increased LE swelling, palpitations, dizziness or syncope except for a general sorenss related to the rib fractures. W/u in the ER neg for ACS.  Lives alone, has a cousin in [place] who helps with transportation.  Lost 6 lbs since seen march 2012, over 20 prior to that gradually.  Overall good compliance with treatment, and good medicine tolerability. Today c/o "stopped up" bilat ear with some hearing loss that seems to come and go.  Does have several wks ongoing nasal allergy symptoms with clear congestion, itch and sneeze, without fever, pain, ST, cough or wheezing.   Pt denies fever, wt loss, night sweats, loss of appetite, or other constitutional symptoms Past Medical History  Diagnosis Date  . ALLERGIC RHINITIS 08/12/2006  . BLINDNESS, LEFT EYE 08/12/2006  . DEPRESSION 08/12/2006  . DIVERTICULOSIS, COLON 08/12/2006  . GERD 04/10/2010  . Headache 08/12/2006  . HYPERTENSION 08/12/2006  . LUNG CANCER, HX OF 08/12/2006  . PALPITATIONS, RECURRENT 04/10/2010  . PROSTATE CANCER, HX OF 08/12/2006  . PROSTATE CANCER, METASTATIC 04/11/2010  . SINUSITIS- ACUTE-NOS 04/10/2010  . SKIN CANCER, HX OF 08/12/2006  . URINARY INCONTINENCE 08/12/2006   Past Surgical History  Procedure Date  . Removal skin cancer     SHOULDER  . Cataract  surgery     RIGHT  . Tonsillectomy     reports that he has never smoked. He does not have any smokeless tobacco history on file. He reports that he does not drink alcohol or use illicit drugs. family history includes Lung cancer in his mother. Allergies  Allergen Reactions  . Sulfonamide Derivatives    Current Outpatient Prescriptions on File Prior to Visit  Medication Sig Dispense Refill  . citalopram (CELEXA) 10 MG tablet Take 10 mg by mouth daily.        . enalapril (VASOTEC) 2.5 MG tablet Take 2.5 mg by mouth daily.        . metoprolol (TOPROL-XL) 50 MG 24 hr tablet Take 25 mg by mouth 2 (two) times daily.        Marland Kitchen omeprazole (PRILOSEC) 20 MG capsule Take 20 mg by mouth daily.        . simvastatin (ZOCOR) 40 MG tablet Take 40 mg by mouth at bedtime.         Review of Systems Review of Systems  Constitutional: Negative for diaphoresis and unexpected weight change.  HENT: Negative for drooling and tinnitus.   Eyes: Negative for photophobia and visual disturbance.  Respiratory: Negative for choking and stridor.   Gastrointestinal: Negative for vomiting and blood in stool.  Genitourinary: Negative for hematuria and decreased urine volume.       Objective:  Physical Exam BP 120/80  Pulse 70  Temp(Src) 98 F (36.7 C) (Oral)  Ht 5\' 8"  (1.727 m)  Wt 126 lb 4 oz (57.267 kg)  BMI 19.20 kg/m2  SpO2 98% Physical Exam  VS noted Constitutional: Pt appears well-developed and well-nourished.  HENT: Head: Normocephalic.  Right Ear: External ear normal.  Left Ear: External ear normal.  bilat canals cleared of wax  - hearing improved Bilat tm's mild erythema.  Sinus nontender.  Pharynx mild erythema Eyes: Conjunctivae and EOM are normal. Pupils are equal, round, and reactive to light.  Neck: Normal range of motion. Neck supple.  Cardiovascular: Normal rate and regular rhythm.   Pulmonary/Chest: Effort normal and breath sounds normal.  Abd:  Soft, NT, non-distended, +  BS Neurological: Pt is alert. No cranial nerve deficit.  Skin: Skin is warm. No erythema. no bruising, bleeding Psychiatric: Pt behavior is normal. Thought content normal.     Assessment & Plan:

## 2010-11-20 NOTE — Assessment & Plan Note (Signed)
Pt states has hx of iron deficiency in the past;  For f/u cbc, iron/b12 today

## 2010-11-21 NOTE — Telephone Encounter (Signed)
Called the patient line was busy. 

## 2010-11-21 NOTE — Telephone Encounter (Signed)
Patient informed. 

## 2010-11-23 ENCOUNTER — Encounter: Payer: Self-pay | Admitting: Internal Medicine

## 2010-12-04 DIAGNOSIS — R079 Chest pain, unspecified: Secondary | ICD-10-CM

## 2010-12-04 DIAGNOSIS — H548 Legal blindness, as defined in USA: Secondary | ICD-10-CM

## 2010-12-04 DIAGNOSIS — R634 Abnormal weight loss: Secondary | ICD-10-CM

## 2010-12-04 DIAGNOSIS — R269 Unspecified abnormalities of gait and mobility: Secondary | ICD-10-CM

## 2010-12-10 ENCOUNTER — Telehealth: Payer: Self-pay

## 2010-12-10 NOTE — Telephone Encounter (Signed)
He was recently found to have iron Deficiency anemia, as so was referred to GI , and I think he should keep his appt for this  I will also refer to Card per pt request

## 2010-12-10 NOTE — Telephone Encounter (Signed)
The patient called with confusion over his appointment in November with Dr. Leone Payor. The patient is still experiencing aching in his chest area and feels he needs an appointment with a cardiologist and not a GI MD and does not know why he is going to a GI MD.

## 2010-12-10 NOTE — Telephone Encounter (Signed)
Called the patient informed. 

## 2010-12-24 ENCOUNTER — Ambulatory Visit: Payer: Medicare PPO | Admitting: Internal Medicine

## 2010-12-28 ENCOUNTER — Ambulatory Visit: Payer: Medicare PPO | Admitting: Cardiology

## 2010-12-28 ENCOUNTER — Telehealth: Payer: Self-pay | Admitting: *Deleted

## 2010-12-28 NOTE — Telephone Encounter (Signed)
This was done in error.

## 2011-01-24 ENCOUNTER — Encounter: Payer: Self-pay | Admitting: Cardiology

## 2011-03-27 ENCOUNTER — Ambulatory Visit (INDEPENDENT_AMBULATORY_CARE_PROVIDER_SITE_OTHER): Payer: Medicare PPO | Admitting: Family Medicine

## 2011-03-27 VITALS — BP 116/76 | HR 86 | Temp 98.9°F | Resp 18 | Ht 68.0 in | Wt 118.0 lb

## 2011-03-27 DIAGNOSIS — I10 Essential (primary) hypertension: Secondary | ICD-10-CM

## 2011-03-27 DIAGNOSIS — C61 Malignant neoplasm of prostate: Secondary | ICD-10-CM

## 2011-03-27 DIAGNOSIS — C800 Disseminated malignant neoplasm, unspecified: Secondary | ICD-10-CM

## 2011-03-27 DIAGNOSIS — F341 Dysthymic disorder: Secondary | ICD-10-CM

## 2011-03-27 DIAGNOSIS — R634 Abnormal weight loss: Secondary | ICD-10-CM

## 2011-03-27 DIAGNOSIS — F419 Anxiety disorder, unspecified: Secondary | ICD-10-CM

## 2011-03-27 MED ORDER — CITALOPRAM HYDROBROMIDE 10 MG PO TABS: 10.0000 mg | ORAL_TABLET | Freq: Every day | ORAL | Status: DC

## 2011-03-27 MED ORDER — FERROUS SULFATE 325 (65 FE) MG PO TABS: 325.0000 mg | ORAL_TABLET | Freq: Every day | ORAL | Status: DC

## 2011-03-27 MED ORDER — TAMSULOSIN HCL 0.4 MG PO CAPS: 0.4000 mg | ORAL_CAPSULE | Freq: Every day | ORAL | Status: DC

## 2011-03-27 MED ORDER — METOPROLOL SUCCINATE ER 25 MG PO TB24: 25.0000 mg | ORAL_TABLET | Freq: Two times a day (BID) | ORAL | Status: DC

## 2011-03-27 MED ORDER — OMEPRAZOLE 20 MG PO CPDR: 20.0000 mg | DELAYED_RELEASE_CAPSULE | Freq: Every day | ORAL | Status: DC

## 2011-03-27 MED ORDER — ENALAPRIL MALEATE 2.5 MG PO TABS: 2.5000 mg | ORAL_TABLET | Freq: Every day | ORAL | Status: DC

## 2011-03-27 NOTE — Patient Instructions (Signed)
Encouraged trying to eat more and to supplement the diet with some Ensure to try and help maintain his weight.  If he gets worse at any time he can call the ambulance and be taken to the emergency room.  Continue current medications.  Return about 3 months.

## 2011-03-27 NOTE — Progress Notes (Signed)
Subjective: Patient is here for a prescription refill. He wants his Flomax refill. He doesn't need all his other medicines yet, but he would like a refill also for wound. He doesn't want to be forced to come out for prescription refills. He gets pains in his chest at times. He generally feels pretty well except for losing about 35 pounds. He walks everywhere. His cousin takes in some places, such as to the grocery store at times. He doesn't have any other family. He has had prostate cancer for a number of years. He says that they did a scan on him but they never told him if it spread anywhere.  He doesn't understand why he doesn't get divine healing. He says some people say they did it. We talked about that a little bit. He is [place] man is not scared of dying. He says he doesn't hurt a lot just some of the time.   Objective: Cachectic elderly man no acute distress. Alert and oriented. Throat clear. Neck supple without nodes. Chest clear to auscultation. Heart regular without murmurs. Abdomen soft without organomegaly masses or tenderness. Stratasis the end but no edema.  Assessment: Metastatic prostate cancer with significant weight loss Geriatric age group Hypertension History of anxiety or depression  Plan: Patient preferred for me not to do labs today, but to wait for next time. I reviewed his old records with him and told him that his prostate cancer is present multiple places in the body. At his age there is little else to offer, but it has grown fairly slowly and we hope that continues to do so. He is comfortable with that. I told to call the emergency rescue squad if necessary. He was encouraged to eat more, and to use Ensure supplements. He is to return in 3 months. I represcribed all his medications for him.

## 2011-04-09 ENCOUNTER — Encounter: Payer: Self-pay | Admitting: Cardiology

## 2011-05-13 ENCOUNTER — Other Ambulatory Visit: Payer: Self-pay | Admitting: Internal Medicine

## 2011-06-10 ENCOUNTER — Inpatient Hospital Stay (HOSPITAL_COMMUNITY)
Admission: EM | Admit: 2011-06-10 | Discharge: 2011-06-15 | DRG: 723 | Disposition: A | Discharge status: Hospice | Payer: Medicare HMO | Attending: Internal Medicine | Admitting: Internal Medicine

## 2011-06-10 ENCOUNTER — Encounter (HOSPITAL_COMMUNITY): Payer: Self-pay | Admitting: Emergency Medicine

## 2011-06-10 DIAGNOSIS — D649 Anemia, unspecified: Secondary | ICD-10-CM

## 2011-06-10 DIAGNOSIS — Z0000 Encounter for general adult medical examination without abnormal findings: Secondary | ICD-10-CM

## 2011-06-10 DIAGNOSIS — C772 Secondary and unspecified malignant neoplasm of intra-abdominal lymph nodes: Secondary | ICD-10-CM | POA: Diagnosis present

## 2011-06-10 DIAGNOSIS — H5440 Blindness, one eye, unspecified eye: Secondary | ICD-10-CM

## 2011-06-10 DIAGNOSIS — F3289 Other specified depressive episodes: Secondary | ICD-10-CM

## 2011-06-10 DIAGNOSIS — N390 Urinary tract infection, site not specified: Secondary | ICD-10-CM

## 2011-06-10 DIAGNOSIS — Z66 Do not resuscitate: Secondary | ICD-10-CM | POA: Diagnosis present

## 2011-06-10 DIAGNOSIS — C7951 Secondary malignant neoplasm of bone: Secondary | ICD-10-CM | POA: Diagnosis present

## 2011-06-10 DIAGNOSIS — I10 Essential (primary) hypertension: Secondary | ICD-10-CM

## 2011-06-10 DIAGNOSIS — C61 Malignant neoplasm of prostate: Principal | ICD-10-CM

## 2011-06-10 DIAGNOSIS — N1330 Unspecified hydronephrosis: Secondary | ICD-10-CM | POA: Diagnosis present

## 2011-06-10 DIAGNOSIS — R51 Headache: Secondary | ICD-10-CM

## 2011-06-10 DIAGNOSIS — F419 Anxiety disorder, unspecified: Secondary | ICD-10-CM

## 2011-06-10 DIAGNOSIS — R531 Weakness: Secondary | ICD-10-CM

## 2011-06-10 DIAGNOSIS — E860 Dehydration: Secondary | ICD-10-CM

## 2011-06-10 DIAGNOSIS — N19 Unspecified kidney failure: Secondary | ICD-10-CM

## 2011-06-10 DIAGNOSIS — R338 Other retention of urine: Secondary | ICD-10-CM | POA: Diagnosis present

## 2011-06-10 DIAGNOSIS — R32 Unspecified urinary incontinence: Secondary | ICD-10-CM

## 2011-06-10 DIAGNOSIS — K219 Gastro-esophageal reflux disease without esophagitis: Secondary | ICD-10-CM

## 2011-06-10 DIAGNOSIS — E785 Hyperlipidemia, unspecified: Secondary | ICD-10-CM

## 2011-06-10 DIAGNOSIS — K5730 Diverticulosis of large intestine without perforation or abscess without bleeding: Secondary | ICD-10-CM

## 2011-06-10 DIAGNOSIS — D638 Anemia in other chronic diseases classified elsewhere: Secondary | ICD-10-CM | POA: Diagnosis present

## 2011-06-10 DIAGNOSIS — Z85118 Personal history of other malignant neoplasm of bronchus and lung: Secondary | ICD-10-CM

## 2011-06-10 DIAGNOSIS — J309 Allergic rhinitis, unspecified: Secondary | ICD-10-CM

## 2011-06-10 DIAGNOSIS — F329 Major depressive disorder, single episode, unspecified: Secondary | ICD-10-CM

## 2011-06-10 DIAGNOSIS — Z85828 Personal history of other malignant neoplasm of skin: Secondary | ICD-10-CM

## 2011-06-10 DIAGNOSIS — Z515 Encounter for palliative care: Secondary | ICD-10-CM

## 2011-06-10 DIAGNOSIS — R002 Palpitations: Secondary | ICD-10-CM

## 2011-06-10 DIAGNOSIS — R5383 Other fatigue: Secondary | ICD-10-CM

## 2011-06-10 DIAGNOSIS — N179 Acute kidney failure, unspecified: Secondary | ICD-10-CM

## 2011-06-10 LAB — URINALYSIS, ROUTINE W REFLEX MICROSCOPIC
Glucose, UA: NEGATIVE mg/dL
Protein, ur: 30 mg/dL — AB
Specific Gravity, Urine: 1.018 (ref 1.005–1.030)

## 2011-06-10 LAB — CBC
HCT: 26.8 % — ABNORMAL LOW (ref 39.0–52.0)
Hemoglobin: 9.4 g/dL — ABNORMAL LOW (ref 13.0–17.0)
MCH: 30.7 pg (ref 26.0–34.0)
MCV: 87.6 fL (ref 78.0–100.0)
Platelets: 175 10*3/uL (ref 150–400)
RBC: 3.06 MIL/uL — ABNORMAL LOW (ref 4.22–5.81)
WBC: 9.4 10*3/uL (ref 4.0–10.5)

## 2011-06-10 LAB — POCT I-STAT, CHEM 8
BUN: 69 mg/dL — ABNORMAL HIGH (ref 6–23)
Hemoglobin: 9.5 g/dL — ABNORMAL LOW (ref 13.0–17.0)
Potassium: 4.4 mEq/L (ref 3.5–5.1)
Sodium: 128 mEq/L — ABNORMAL LOW (ref 135–145)
TCO2: 16 mmol/L (ref 0–100)

## 2011-06-10 LAB — DIFFERENTIAL
Basophils Relative: 0 % (ref 0–1)
Eosinophils Relative: 1 % (ref 0–5)
Lymphs Abs: 0.6 10*3/uL — ABNORMAL LOW (ref 0.7–4.0)
Monocytes Relative: 9 % (ref 3–12)
Neutro Abs: 7.9 10*3/uL — ABNORMAL HIGH (ref 1.7–7.7)

## 2011-06-10 LAB — URINE MICROSCOPIC-ADD ON

## 2011-06-10 MED ORDER — DEXTROSE 5 % IV SOLN
1.0000 g | Freq: Once | INTRAVENOUS | Status: AC
  Administered 2011-06-11: 1 g via INTRAVENOUS
  Filled 2011-06-10: qty 10

## 2011-06-10 MED ORDER — SODIUM CHLORIDE 0.9 % IV SOLN
INTRAVENOUS | Status: DC
  Administered 2011-06-10: 23:00:00 via INTRAVENOUS

## 2011-06-10 NOTE — ED Notes (Signed)
Pt alert, nad, hx of prostate cancer, painful urination, per family poor urine output, per family sent here per Urology, Dr Margarita Grizzle, pt resp even unlabored, skin pwd

## 2011-06-10 NOTE — ED Provider Notes (Signed)
History     CSN: [id number]  Arrival date & time 06/10/11  2100   First MD Initiated Contact with Patient 06/10/11 2204      Chief Complaint  Patient presents with  . Sent Per Urology for Eval     (Consider location/radiation/quality/duration/timing/severity/associated sxs/prior treatment) The history is provided by the patient.   the patient is a 78 year old, male, who presented to a urology office today, complaining of inability to empty his bladder.  Approximately 30 pound weight loss over 6 months, and pain in the pelvic area.  Laboratory tests were performed and after the patient left the urologist, called him back into him to come to the emergency department.  Since she has had a Foley placed and emptied his bladder.  He denies pain in his abdomen.  Now.  He denies pain anywhere right now.  He, says that he feels weak.  Several years ago.  He had a PSA level, which was severely elevated however, he never was treated for prostate cancer.  Past Medical History  Diagnosis Date  . ALLERGIC RHINITIS 08/12/2006  . BLINDNESS, LEFT EYE 08/12/2006  . DEPRESSION 08/12/2006  . DIVERTICULOSIS, COLON 08/12/2006  . GERD 04/10/2010  . Headache 08/12/2006  . HYPERTENSION 08/12/2006  . LUNG CANCER, HX OF 08/12/2006  . PALPITATIONS, RECURRENT 04/10/2010  . PROSTATE CANCER, HX OF 08/12/2006  . PROSTATE CANCER, METASTATIC 04/11/2010  . SINUSITIS- ACUTE-NOS 04/10/2010  . SKIN CANCER, HX OF 08/12/2006  . URINARY INCONTINENCE 08/12/2006  . Anemia 11/20/2010    Past Surgical History  Procedure Date  . Removal skin cancer     SHOULDER  . Cataract surgery     RIGHT  . Tonsillectomy     Family History  Problem Relation Age of Onset  . Lung cancer Mother     History  Substance Use Topics  . Smoking status: Never Smoker   . Smokeless tobacco: Not on file  . Alcohol Use: No      Review of Systems  Constitutional: Positive for unexpected weight change.  Respiratory: Negative for cough and  shortness of breath.   Cardiovascular: Negative for chest pain.  Gastrointestinal: Positive for abdominal pain. Negative for nausea and vomiting.  Genitourinary: Positive for decreased urine volume.  Musculoskeletal: Negative for back pain.  Neurological: Positive for weakness. Negative for headaches.  Psychiatric/Behavioral: Negative for confusion.  All other systems reviewed and are negative.    Allergies  Sulfonamide derivatives  Home Medications   Current Outpatient Rx  Name Route Sig Dispense Refill  . CITALOPRAM HYDROBROMIDE 10 MG PO TABS Oral Take 1 tablet (10 mg total) by mouth daily. 30 tablet 5  . ENALAPRIL MALEATE 2.5 MG PO TABS Oral Take 1 tablet (2.5 mg total) by mouth daily. 30 tablet 5  . FERROUS SULFATE 325 (65 FE) MG PO TABS Oral Take 1 tablet (325 mg total) by mouth daily with breakfast. 30 tablet 11  . METOPROLOL TARTRATE 50 MG PO TABS  take 1/2 tablet by mouth twice a day 90 tablet 2  . METOPROLOL SUCCINATE ER 25 MG PO TB24 Oral Take 1 tablet (25 mg total) by mouth 2 (two) times daily. 60 tablet 5  . OMEPRAZOLE 20 MG PO CPDR Oral Take 1 capsule (20 mg total) by mouth daily. 30 capsule 5  . SIMVASTATIN 40 MG PO TABS  take 1 tablet by mouth once daily 90 tablet 2  . TAMSULOSIN HCL 0.4 MG PO CAPS Oral Take 1 capsule (0.4 mg total) by  mouth daily. 30 capsule 5    BP 122/76  Pulse 87  Temp(Src) 98 F (36.7 C) (Oral)  Resp 16  SpO2 99%  Physical Exam  Nursing note and vitals reviewed. Constitutional: He is oriented to person, place, and time. No distress.       Cachectic frail, male, in no distress  HENT:  Head: Normocephalic and atraumatic.  Eyes: Conjunctivae and EOM are normal.  Neck: Normal range of motion.  Cardiovascular: Normal rate.   No murmur heard. Pulmonary/Chest: Effort normal. No respiratory distress.  Abdominal: Soft. He exhibits no distension. There is no tenderness.  Musculoskeletal: Normal range of motion. He exhibits no edema and no  tenderness.  Neurological: He is alert and oriented to person, place, and time.  Skin: Skin is warm and dry.  Psychiatric: He has a normal mood and affect. Thought content normal.    ED Course  Procedures (including critical care time) Probable metastatic prostate cancer, with new renal failure.  No evidence of hyperkalemia.  Will get to the hospital for further evaluation and treatment  Labs Reviewed  CBC - Abnormal; Notable for the following:    RBC 3.06 (*)    Hemoglobin 9.4 (*)    HCT 26.8 (*)    RDW 16.2 (*)    All other components within normal limits  DIFFERENTIAL - Abnormal; Notable for the following:    Neutrophils Relative 84 (*)    Lymphocytes Relative 6 (*)    Neutro Abs 7.9 (*)    Lymphs Abs 0.6 (*)    All other components within normal limits  POCT I-STAT, CHEM 8 - Abnormal; Notable for the following:    Sodium 128 (*)    BUN 69 (*)    Creatinine, Ser 8.80 (*)    Glucose, Bld 132 (*)    Calcium, Ion 1.08 (*)    Hemoglobin 9.5 (*)    HCT 28.0 (*)    All other components within normal limits  URINALYSIS, ROUTINE W REFLEX MICROSCOPIC   No results found.   No diagnosis found.  11:21 PM Spoke with [facility]. They will admit.  MDM  Probable metastatic prostate Renal failure, without hyperkalemia Weakness         Cheri Guppy, MD 06/10/11 2321

## 2011-06-11 ENCOUNTER — Inpatient Hospital Stay (HOSPITAL_COMMUNITY): Payer: Medicare HMO

## 2011-06-11 ENCOUNTER — Ambulatory Visit (HOSPITAL_COMMUNITY): Payer: Medicare HMO

## 2011-06-11 ENCOUNTER — Encounter (HOSPITAL_COMMUNITY): Payer: Medicare HMO

## 2011-06-11 ENCOUNTER — Emergency Department (HOSPITAL_COMMUNITY): Payer: Medicare HMO

## 2011-06-11 DIAGNOSIS — N19 Unspecified kidney failure: Secondary | ICD-10-CM

## 2011-06-11 DIAGNOSIS — N179 Acute kidney failure, unspecified: Secondary | ICD-10-CM | POA: Diagnosis present

## 2011-06-11 DIAGNOSIS — E782 Mixed hyperlipidemia: Secondary | ICD-10-CM

## 2011-06-11 DIAGNOSIS — M899 Disorder of bone, unspecified: Secondary | ICD-10-CM

## 2011-06-11 DIAGNOSIS — C7951 Secondary malignant neoplasm of bone: Secondary | ICD-10-CM

## 2011-06-11 DIAGNOSIS — Z8546 Personal history of malignant neoplasm of prostate: Secondary | ICD-10-CM

## 2011-06-11 DIAGNOSIS — C7952 Secondary malignant neoplasm of bone marrow: Secondary | ICD-10-CM

## 2011-06-11 DIAGNOSIS — N390 Urinary tract infection, site not specified: Secondary | ICD-10-CM | POA: Diagnosis present

## 2011-06-11 DIAGNOSIS — C61 Malignant neoplasm of prostate: Principal | ICD-10-CM | POA: Diagnosis present

## 2011-06-11 LAB — VITAMIN B12: Vitamin B-12: 958 pg/mL — ABNORMAL HIGH (ref 211–911)

## 2011-06-11 LAB — CBC
HCT: 24.2 % — ABNORMAL LOW (ref 39.0–52.0)
Hemoglobin: 8.6 g/dL — ABNORMAL LOW (ref 13.0–17.0)
MCH: 30.9 pg (ref 26.0–34.0)
MCHC: 35.5 g/dL (ref 30.0–36.0)
MCV: 87.1 fL (ref 78.0–100.0)
RBC: 2.78 MIL/uL — ABNORMAL LOW (ref 4.22–5.81)

## 2011-06-11 LAB — COMPREHENSIVE METABOLIC PANEL
ALT: 17 U/L (ref 0–53)
BUN: 56 mg/dL — ABNORMAL HIGH (ref 6–23)
CO2: 19 mEq/L (ref 19–32)
Calcium: 9.1 mg/dL (ref 8.4–10.5)
GFR calc Af Amer: 10 mL/min — ABNORMAL LOW (ref >90)
GFR calc non Af Amer: 9 mL/min — ABNORMAL LOW (ref >90)
Glucose, Bld: 102 mg/dL — ABNORMAL HIGH (ref 70–99)
Total Protein: 5.6 g/dL — ABNORMAL LOW (ref 6.0–8.3)

## 2011-06-11 LAB — FOLATE: Folate: 15.2 ng/mL

## 2011-06-11 LAB — IRON AND TIBC
Saturation Ratios: 7 % — ABNORMAL LOW (ref 20–55)
TIBC: 170 ug/dL — ABNORMAL LOW (ref 215–435)

## 2011-06-11 LAB — RETICULOCYTES
RBC.: 2.8 MIL/uL — ABNORMAL LOW (ref 4.22–5.81)
Retic Ct Pct: 1.1 % (ref 0.4–3.1)

## 2011-06-11 MED ORDER — FERROUS SULFATE 325 (65 FE) MG PO TABS
325.0000 mg | ORAL_TABLET | Freq: Every day | ORAL | Status: DC
  Administered 2011-06-11 – 2011-06-12 (×2): 325 mg via ORAL
  Filled 2011-06-11 (×3): qty 1

## 2011-06-11 MED ORDER — CITALOPRAM HYDROBROMIDE 10 MG PO TABS
10.0000 mg | ORAL_TABLET | Freq: Every day | ORAL | Status: DC
  Administered 2011-06-11 – 2011-06-15 (×5): 10 mg via ORAL
  Filled 2011-06-11 (×5): qty 1

## 2011-06-11 MED ORDER — DEXTROSE 5 % IV SOLN
1.0000 g | INTRAVENOUS | Status: DC
  Administered 2011-06-12 (×2): 1 g via INTRAVENOUS
  Filled 2011-06-11 (×3): qty 10

## 2011-06-11 MED ORDER — TECHNETIUM TC 99M MEDRONATE IV KIT
25.0000 | PACK | Freq: Once | INTRAVENOUS | Status: AC | PRN
  Administered 2011-06-11: 25 via INTRAVENOUS

## 2011-06-11 MED ORDER — SODIUM CHLORIDE 0.9 % IV SOLN
INTRAVENOUS | Status: DC
  Administered 2011-06-11 – 2011-06-15 (×7): via INTRAVENOUS

## 2011-06-11 MED ORDER — ONDANSETRON HCL 4 MG/2ML IJ SOLN
4.0000 mg | Freq: Four times a day (QID) | INTRAMUSCULAR | Status: DC | PRN
  Administered 2011-06-11: 4 mg via INTRAVENOUS
  Filled 2011-06-11: qty 2

## 2011-06-11 MED ORDER — SIMVASTATIN 40 MG PO TABS
40.0000 mg | ORAL_TABLET | Freq: Every day | ORAL | Status: DC
  Administered 2011-06-11: 40 mg via ORAL
  Filled 2011-06-11 (×2): qty 1

## 2011-06-11 MED ORDER — SENNA 8.6 MG PO TABS: 1.0000 | ORAL_TABLET | Freq: Every day | ORAL | Status: DC | PRN

## 2011-06-11 MED ORDER — METOPROLOL TARTRATE 25 MG PO TABS
25.0000 mg | ORAL_TABLET | Freq: Two times a day (BID) | ORAL | Status: DC
  Administered 2011-06-11 – 2011-06-15 (×10): 25 mg via ORAL
  Filled 2011-06-11 (×11): qty 1

## 2011-06-11 MED ORDER — ACETAMINOPHEN 650 MG RE SUPP: 650.0000 mg | Freq: Four times a day (QID) | RECTAL | Status: DC | PRN

## 2011-06-11 MED ORDER — PANTOPRAZOLE SODIUM 40 MG PO TBEC
40.0000 mg | DELAYED_RELEASE_TABLET | Freq: Every day | ORAL | Status: DC
  Administered 2011-06-11 – 2011-06-15 (×5): 40 mg via ORAL
  Filled 2011-06-11 (×5): qty 1

## 2011-06-11 MED ORDER — OXYCODONE HCL 5 MG PO TABS: 5.0000 mg | ORAL_TABLET | ORAL | Status: DC | PRN

## 2011-06-11 MED ORDER — ONDANSETRON HCL 4 MG PO TABS: 4.0000 mg | ORAL_TABLET | Freq: Four times a day (QID) | ORAL | Status: DC | PRN

## 2011-06-11 MED ORDER — TAMSULOSIN HCL 0.4 MG PO CAPS
0.4000 mg | ORAL_CAPSULE | Freq: Every day | ORAL | Status: DC
  Administered 2011-06-11 – 2011-06-12 (×2): 0.4 mg via ORAL
  Filled 2011-06-11 (×2): qty 1

## 2011-06-11 MED ORDER — ACETAMINOPHEN 325 MG PO TABS
650.0000 mg | ORAL_TABLET | Freq: Four times a day (QID) | ORAL | Status: DC | PRN
  Administered 2011-06-12: 325 mg via ORAL
  Filled 2011-06-11: qty 1

## 2011-06-11 MED ORDER — ENOXAPARIN SODIUM 30 MG/0.3ML ~~LOC~~ SOLN
30.0000 mg | SUBCUTANEOUS | Status: DC
  Administered 2011-06-11 – 2011-06-13 (×3): 30 mg via SUBCUTANEOUS
  Filled 2011-06-11 (×3): qty 0.3

## 2011-06-11 NOTE — Progress Notes (Addendum)
Will consult oncology as this is recommended by urology Renal failure is mainly postobstructive , CT shows bilateral hydroureteronephrosis, right greater than left possibly due to compression by adenopathy.  UROLOGY to place stent , ?   Agree with Dr Seward Meth plan, continue ABX , hold off on nephrology consult , until urology reviews the CT scan Bone scan in am  SPEP,UPEP

## 2011-06-11 NOTE — Consult Note (Signed)
[facility] CONSULTATION NOTE  Reason for Consult:  Metastatic Prostate Cancer Referring MD: Dr. Susie Cassette Primary: Dr. Jonny Ruiz Adolph Pollack)  HQI:ONGEXBM I Warren Christensen is a 78 y.o. white  male with a history of  metastatic prostate cancer lost to follow up, previously seen by Dr.Humphreys, presenting to  [facility] on 06/10/11 with urinary retention and 2 week history of diffuse pain on lower abdomen, lower back and legs. On exam by Dr. Bettye Boeck, patient had a palpably distended bladder and prostate concerning for advanced local prostate cancer. At the clinic he received Deborra Medina, androgen deprivation drug to drop his testosterone levels to castrate levels in 24 hrs. Labs at the clinic included PSA level of 1299. CT of the A/P and bone scan had been ordered. However, due to the severity of his symptoms as well as a  30 lbs weight loss over the last 2 months due to poor po intake and  acute renal failure (likely postobstructive) it was felt necessary to admit the patient via ED.  A renal ultrasound showed moderate bilateral pyelocaliectasis, distended bladder despite presence of a Foley catheter, enlarged prostate gland 5.5 x 4.7 x 4.5 cm. , right pleural effusion and  upper abdominal ascites. CT abdomen and pelvis without contrast confirmed the findings, with osseous metastatic disease, bulky pelvic and abdominal lymphadenopathy consistent with metastatic disease, probable seminal vesicle involvement with tumor on the right side versus adenopathy,ascites and mesenteric edema, mild bilateral hydroureteronephrosis, right greater than left possibly due to compression by adenopathy.        PMH: Past Medical History  Diagnosis Date  . ALLERGIC RHINITIS 08/12/2006  . BLINDNESS, LEFT EYE 08/12/2006  . DEPRESSION 08/12/2006  . DIVERTICULOSIS, COLON 08/12/2006  . GERD 04/10/2010  . Headache 08/12/2006  . HYPERTENSION 08/12/2006  . LUNG CANCER, HX OF 08/12/2006  . PALPITATIONS, RECURRENT 04/10/2010  .  PROSTATE CANCER, HX OF 08/12/2006  . PROSTATE CANCER, METASTATIC 04/11/2010  . SINUSITIS- ACUTE-NOS 04/10/2010  . SKIN CANCER, HX OF 08/12/2006  . URINARY INCONTINENCE 08/12/2006  . Anemia 11/20/2010    Surgeries: Past Surgical History  Procedure Date  . Removal skin cancer     SHOULDER  . Cataract surgery     RIGHT  . Tonsillectomy     Allergies:  Allergies  Allergen Reactions  . Sulfonamide Derivatives     Medications:  Prior to Admission:  Prescriptions prior to admission  Medication Sig Dispense Refill  . citalopram (CELEXA) 10 MG tablet Take 1 tablet (10 mg total) by mouth daily.  30 tablet  5  . enalapril (VASOTEC) 2.5 MG tablet Take 1 tablet (2.5 mg total) by mouth daily.  30 tablet  5  . ferrous sulfate 325 (65 FE) MG tablet Take 1 tablet (325 mg total) by mouth daily with breakfast.  30 tablet  11  . metoprolol (LOPRESSOR) 50 MG tablet take 1/2 tablet by mouth twice a day  90 tablet  2  . metoprolol succinate (TOPROL-XL) 25 MG 24 hr tablet Take 1 tablet (25 mg total) by mouth 2 (two) times daily.  60 tablet  5  . omeprazole (PRILOSEC) 20 MG capsule Take 1 capsule (20 mg total) by mouth daily.  30 capsule  5  . simvastatin (ZOCOR) 40 MG tablet take 1 tablet by mouth once daily  90 tablet  2  . Tamsulosin HCl (FLOMAX) 0.4 MG CAPS Take 1 capsule (0.4 mg total) by mouth daily.  30 capsule  5    WUX:[id number],  acetaminophen, ondansetron (ZOFRAN) IV, ondansetron, oxyCODONE, senna, technetium medronate  ROS: See HPI for significant positives. Rest of the ROS negative except for L eye blindness due to cataracts. .   Family History:  Family History  Problem Relation Age of Onset  . Lung cancer Mother     Social History:  reports that he has never smoked. He does not have any smokeless tobacco history on file. He reports that he does not drink alcohol or use illicit drugs.[employer] veteran. Patient is single. No children. Lives alone. Retired Education officer, environmental.    Physical  Exam  78 year old white male  in no acute distress . Ill appearing, thin, well developed.  HEENT: Normocephalic, atraumatic,  Temporal wasting PERRLA on R eye.  R pupil asymmetrical . Oral cavity without thrush or lesions. Neck supple. no thyromegaly, no cervical or supraclavicular adenopathy  Lungs clear bilaterally . No wheezing, rhonchi or rales. No axillary masses. Cardiac regular rate and rhythm normal S1-S2, no murmur , rubs or gallops Abdomen soft  Tender on the suprapubic area. bowel sounds x4. No HSM GU/rectal: deferred. As per urology Extremities no clubbing cyanosis or edema. No bruising or petechial rash     Labs:  CBC   Lab 06/11/11 0455 06/10/11 2205 06/10/11 2203  WBC 7.3 -- 9.4  HGB 8.6* 9.5* 9.4*  HCT 24.2* 28.0* 26.8*  PLT 168 -- 175  MCV 87.1 -- 87.6  MCH 30.9 -- 30.7  MCHC 35.5 -- 35.1  RDW 16.4* -- 16.2*  LYMPHSABS -- -- 0.6*  MONOABS -- -- 0.8  EOSABS -- -- 0.1  BASOSABS -- -- 0.0  BANDABS -- -- --      CMP    Lab 06/11/11 0455 06/10/11 2205  NA 134* 128*  K 3.9 4.4  CL 103 106  CO2 19 --  GLUCOSE 102* 132*  BUN 56* 69*  CREATININE 5.26* 8.80*  CALCIUM 9.1 --  MG -- --  AST 21 --  ALT 17 --  ALKPHOS 78 --  BILITOT 0.1* --        Component Value Date/Time   BILITOT 0.1* 06/11/2011 0455   BILIDIR 0.1 04/10/2010 1445     Imaging Studies:  Ct Abdomen Pelvis Wo Contrast  06/11/2011  *RADIOLOGY REPORT*  Clinical Data: Metastatic prostate cancer.  CT ABDOMEN AND PELVIS WITHOUT CONTRAST  Technique:  Multidetector CT imaging of the abdomen and pelvis was performed following the standard protocol without intravenous contrast.  Comparison: Chest CT 11/15/2010.  Findings: The lung bases demonstrate a moderate sized right pleural effusion with overlying atelectasis.  There is also a small left effusion.  The unenhanced appearance of the liver is grossly normal.  No obvious lesions or biliary dilatation.  The gallbladder is normal. No definite  common bile duct dilatation.  The pancreas is unremarkable.  The spleen is normal in size.  No focal lesions. The adrenal glands are unremarkable.  There is mild bilateral hydroureteronephrosis along with right-sided renal cysts.  There is bulky mesenteric and retroperitoneal lymphadenopathy. There may be the compression of the ureters causing the ureteral dilatation.  There is also bulky pelvic lymphadenopathy, greater on the left side.  The aorta demonstrates moderate atherosclerotic changes.  No aneurysm.  The bladder has a Foley catheter within.  Mild diffuse bladder wall thickening is noted.  The prostate gland is normal in size but somewhat irregular.  The right seminal vesicle is enlarged and irregular.  This is worrisome for tumor.  There is mesenteric edema and  mild ascites.  Examination of the bony structures demonstrates mixed lytic and sclerotic metastatic bone disease involving the spine and pelvis lower rib involvement is also noted.  No obvious pathologic fracture.  IMPRESSION:  1.  Osseous metastatic disease.  No obvious pathologic fracture. 2.  Bulky pelvic and abdominal lymphadenopathy consistent with metastatic disease. 3.  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 4.  Ascites and mesenteric edema. 5.  Mild bilateral hydroureteronephrosis, right greater than left possibly due to compression by adenopathy.  Original Report Authenticated By: P. Loralie Champagne, M.D.   US Renal  06/11/2011  *RADIOLOGY REPORT*  Clinical Data: Elevated BUN and creatinine.  Hematuria.  Urinary incontinence.  History of prostate cancer.  RENAL/URINARY TRACT ULTRASOUND COMPLETE  Comparison:  None  Findings:  Right Kidney:  Right kidney measures 10.8 cm length.  There is moderate pyelocaliectasis.  Simple appearing cyst in the lower pole measuring about 4.5 cm diameter.  Left Kidney:  The left kidney measures 10.4 cm length.  Moderate pyelocaliectasis.  No mass lesions  identified.  Bladder:  There is a Foley catheter present in the bladder.  The bladder is distended.  This suggests that the Foley catheter is flat or obstructed.  Bilateral urine flow jets are demonstrated on color Doppler imaging.  No bladder wall thickening.  No intraluminal filling defects.  The prostate gland appears moderately enlarged, measuring 5.5 x 4.7 x 4.5 cm.  There is an impression on the bladder base caused by the prostate gland.  Incidental note of moderate sized right pleural effusion and small amount of ascites around the liver.  IMPRESSION: Moderate bilateral pyelocaliectasis. Distended bladder despite presence of a Foley catheter.  Enlarged prostate gland.  Right pleural effusion.  Mild upper abdominal ascites.      IMPRESSION AND PLAN   78 y.o. male with advanced prostate cancer with bone and soft tissue involvement.  Disease complicated by diffuse bone pain.  He has received a dose of  Firmagon to decrease PSA level.  It is reasonable to wait and watch what this does.  If PSA remains elevated and patient remains symptomatic would advise hospice referral.  In the meantime, would highly recommend an MRI of the T/L spine to rule out cord compression.  Would not recommend zometa in presence of renal failure.     Daenerys Buttram I.

## 2011-06-11 NOTE — Consult Note (Addendum)
Urology Consult  CC: Metastatic prostate cancer. Urinary retention.  HPI: 78 year old male walk in the clinic yesterday without an appointment complaining of pain in his back, hips, legs, and shoulders. He also complained of a weak urinary stream. It had been present for one year. It was worse over the past 3 days. Nothing made it better or worse. It was associated with urinary urgency. He had a palpably distended bladder on physical exam. He had a hard, firm prostate concerning for advanced local prostate cancer. A Foley catheter was placed and we discussed workup of his condition.  He has a history of metastatic prostate cancer. Because of this he was given a shot of Firmagon, which is an androgen deprivation medication which will drop his testosterone levels to castrate levels in approximately 24 hours. I obtained laboratory tests and scheduled him for CT and bone scan. The patient left the clinic in several hours later I received the results of tests. His serum creatinine was 11 with a GFR of 3. He had multiple electrolyte abnormalities. I contacted him and recommended he reports to the ER immediately.  I received the results of his PSA this morning from my office which was elevated at 1299.  Today the patient feels much better. I suspect some of his pain may have been from urinary obstruction, but there are recent studies from 2012 which shows metastasis to the bones.  PMH: Past Medical History  Diagnosis Date  . ALLERGIC RHINITIS 08/12/2006  . BLINDNESS, LEFT EYE 08/12/2006  . DEPRESSION 08/12/2006  . DIVERTICULOSIS, COLON 08/12/2006  . GERD 04/10/2010  . Headache 08/12/2006  . HYPERTENSION 08/12/2006  . LUNG CANCER, HX OF 08/12/2006  . PALPITATIONS, RECURRENT 04/10/2010  . PROSTATE CANCER, HX OF 08/12/2006  . PROSTATE CANCER, METASTATIC 04/11/2010  . SINUSITIS- ACUTE-NOS 04/10/2010  . SKIN CANCER, HX OF 08/12/2006  . URINARY INCONTINENCE 08/12/2006  . Anemia 11/20/2010    PSH: Past  Surgical History  Procedure Date  . Removal skin cancer     SHOULDER  . Cataract surgery     RIGHT  . Tonsillectomy     Allergies: Allergies  Allergen Reactions  . Sulfonamide Derivatives     Medications: Prescriptions prior to admission  Medication Sig Dispense Refill  . citalopram (CELEXA) 10 MG tablet Take 1 tablet (10 mg total) by mouth daily.  30 tablet  5  . enalapril (VASOTEC) 2.5 MG tablet Take 1 tablet (2.5 mg total) by mouth daily.  30 tablet  5  . ferrous sulfate 325 (65 FE) MG tablet Take 1 tablet (325 mg total) by mouth daily with breakfast.  30 tablet  11  . metoprolol (LOPRESSOR) 50 MG tablet take 1/2 tablet by mouth twice a day  90 tablet  2  . metoprolol succinate (TOPROL-XL) 25 MG 24 hr tablet Take 1 tablet (25 mg total) by mouth 2 (two) times daily.  60 tablet  5  . omeprazole (PRILOSEC) 20 MG capsule Take 1 capsule (20 mg total) by mouth daily.  30 capsule  5  . simvastatin (ZOCOR) 40 MG tablet take 1 tablet by mouth once daily  90 tablet  2  . Tamsulosin HCl (FLOMAX) 0.4 MG CAPS Take 1 capsule (0.4 mg total) by mouth daily.  30 capsule  5     Social History: History   Social History  . Marital Status: Single    Spouse Name: N/A    Number of Children: N/A  . Years of Education: N/A  Occupational History  . Not on file.   Social History Main Topics  . Smoking status: Never Smoker   . Smokeless tobacco: Not on file  . Alcohol Use: No  . Drug Use: No  . Sexually Active: Not on file   Other Topics Concern  . Not on file   Social History Narrative  . No narrative on file    Family History: Family History  Problem Relation Age of Onset  . Lung cancer Mother     Review of Systems: Positive: Fatigue, weight loss, weak stream, double vision, blurry vision. Negative: SOB, nausea.  A further 10 point review of systems was negative except what is listed in the HPI.  Physical Exam:  General: No acute distress.  Awake. Head:  Normocephalic.   Atraumatic. ENT:  EOMI.  Mucous membranes moist Neck:  Supple.  No lymphadenopathy. CV:  S1 present. S2 present. Regular rate. Pulmonary: Equal effort bilaterally.  Clear to auscultation bilaterally. Abdomen: Soft.  Non- tender to palpation. Skin:  Normal turgor.  No visible rash. Extremity: No gross deformity of bilateral upper extremities.  No gross deformity of    bilateral lower extremities. Neurologic: Alert. Appropriate mood.  Penis:  Circumcised.  No lesions. Urethra: Foley catheter in place.  Orthotopic meatus. Scrotum: No lesions.  No ecchymosis.  No erythema.  Studies:  Recent Labs  [facility] 06/11/11 0455 06/10/11 2205 06/10/11 2203   HGB 8.6* 9.5* --   WBC 7.3 -- 9.4   PLT 168 -- 175    Recent Labs  Basename 06/11/11 0455 06/10/11 2205   NA 134* 128*   K 3.9 4.4   CL 103 106   CO2 19 --   BUN 56* 69*   CREATININE 5.26* 8.80*   CALCIUM 9.1 --   GFRNONAA 9* --   GFRAA 10* --     No results found for this basename: PT:2,INR:2,APTT:2 in the last 72 hours   No components found with this basename: ABG:2    Assessment:  Metastatic prostate cancer. Urinary retention.  Plan: -I explained that his retention is likely due to advanced local prostate cancer. He may be able to discontinue the Foley catheter in the future if the Firmagon shrink his prostate locally, but I do not know if this will be the case. For now he will need to continue the catheter. He had a renal ultrasound which shows possible catheter obstruction, but he was able to put out over 3 L of urine since that time. I do not feel that his catheters obstructed, but more likely he is experiencing postobstructive diuresis and creating more urine than his catheter can drain quickly.  -PSA 1299 on 06/10/11 in the office. Given shot of [place] Mont. I will order a CT and bone scan today. This patient's prostate cancer is beyond local control and the cancer will need to be managed by a medical  oncologist.      Pager: [phone]

## 2011-06-11 NOTE — H&P (Addendum)
PCP:   Oliver Barre, MD, MD   New urologist: Dr. Margarita Grizzle  Chief Complaint:  Abnormal labs, difficulty urinating  HPI: [name] is a 78 year old male with metastatic prostate cancer lost to followup, previously followed by Dr. Wanda Plump, presented to [facility] today with difficulty urinating, decreased urine output, pain at multiple sites, he subsequently had a Foley catheter placed and labs drawn then went home, was called by his urologist today and advised to come to the ER due to renal failure. Patient reports a 30 pound weight loss over the last 2 months, intermittent lower abdominal pain, pain in his lower back, legs for the last couple weeks. He denies any fevers or chills, reports 1 episode of vomiting today. On further questioning he reports taking OTC ibuprofen every 4 hours for the last few days along with Tylenol which hasn't helped his pain much though.  Allergies:   Allergies  Allergen Reactions  . Sulfonamide Derivatives       Past Medical History  Diagnosis Date  . ALLERGIC RHINITIS 08/12/2006  . BLINDNESS, LEFT EYE 08/12/2006  . DEPRESSION 08/12/2006  . DIVERTICULOSIS, COLON 08/12/2006  . GERD 04/10/2010  . Headache 08/12/2006  . HYPERTENSION 08/12/2006  . LUNG CANCER, HX OF 08/12/2006  . PALPITATIONS, RECURRENT 04/10/2010  . PROSTATE CANCER, HX OF 08/12/2006  . PROSTATE CANCER, METASTATIC 04/11/2010  . SINUSITIS- ACUTE-NOS 04/10/2010  . SKIN CANCER, HX OF 08/12/2006  . URINARY INCONTINENCE 08/12/2006  . Anemia 11/20/2010    Past Surgical History  Procedure Date  . Removal skin cancer     SHOULDER  . Cataract surgery     RIGHT  . Tonsillectomy     Prior to Admission medications   Medication Sig Start Date End Date Taking? Authorizing Provider  citalopram (CELEXA) 10 MG tablet Take 1 tablet (10 mg total) by mouth daily. 03/27/11  Yes Peyton Najjar, MD  enalapril (VASOTEC) 2.5 MG tablet Take 1 tablet (2.5 mg total) by mouth daily. 03/27/11  Yes Peyton Najjar, MD  ferrous sulfate 325 (65 FE) MG tablet Take 1 tablet (325 mg total) by mouth daily with breakfast. 03/27/11 03/26/12 Yes Peyton Najjar, MD  metoprolol (LOPRESSOR) 50 MG tablet take 1/2 tablet by mouth twice a day 05/13/11  Yes Corwin Levins, MD  metoprolol succinate (TOPROL-XL) 25 MG 24 hr tablet Take 1 tablet (25 mg total) by mouth 2 (two) times daily. 03/27/11  Yes Peyton Najjar, MD  omeprazole (PRILOSEC) 20 MG capsule Take 1 capsule (20 mg total) by mouth daily. 03/27/11  Yes Peyton Najjar, MD  simvastatin (ZOCOR) 40 MG tablet take 1 tablet by mouth once daily 05/13/11  Yes Corwin Levins, MD  Tamsulosin HCl (FLOMAX) 0.4 MG CAPS Take 1 capsule (0.4 mg total) by mouth daily. 03/27/11  Yes Peyton Najjar, MD    Social History:  reports that he has never smoked. He does not have any smokeless tobacco history on file. He reports that he does not drink alcohol or use illicit drugs.  Family History  Problem Relation Age of Onset  . Lung cancer Mother     Review of Systems:  Constitutional: Denies fever, chills, diaphoresis, appetite change and fatigue.  HEENT: Denies photophobia, eye pain, redness, hearing loss, ear pain, congestion, sore throat, rhinorrhea, sneezing, mouth sores, trouble swallowing, neck pain, neck stiffness and tinnitus.   Respiratory: Denies SOB, DOE, cough, chest tightness,  and wheezing.   Cardiovascular: Denies chest pain, palpitations and leg  swelling.  Gastrointestinal: Denies nausea, vomiting, abdominal pain, diarrhea, constipation, blood in stool and abdominal distention.  Genitourinary: Denies dysuria, urgency, frequency, hematuria, flank pain and difficulty urinating.  Musculoskeletal: Denies myalgias, back pain, joint swelling, arthralgias and gait problem.  Skin: Denies pallor, rash and wound.  Neurological: Denies dizziness, seizures, syncope, weakness, light-headedness, numbness and headaches.  Hematological: Denies adenopathy. Easy bruising, personal or  family bleeding history  Psychiatric/Behavioral: Denies suicidal ideation, mood changes, confusion, nervousness, sleep disturbance and agitation   Physical Exam: Blood pressure 146/74, pulse 90, temperature 97.8 F (36.6 C), temperature source Oral, resp. rate 30, SpO2 99.00%. General exam: Frail elderly male, Alert awake oriented x3 in no acute distress HEENT oral mucosa dry neck no JVD or lymphadenopathy CVS S1-S2 regular rate rhythm no murmurs rubs or gallops Lungs clear auscultation bilaterally Abdomen: Slightly protuberant nontender, soft, no organomegaly appreciated no flank tenderness Extremities no edema clubbing or cyanosis Neuro moves all extremities no localizing signs Labs on Admission:  Results for orders placed during the hospital encounter of 06/10/11 (from the past 48 hour(s))  CBC     Status: Abnormal   Collection Time   06/10/11 10:03 PM      Component Value Range Comment   WBC 9.4  4.0 - 10.5 (K/uL)    RBC 3.06 (*) 4.22 - 5.81 (MIL/uL)    Hemoglobin 9.4 (*) 13.0 - 17.0 (g/dL)    HCT 16.1 (*) 09.6 - 52.0 (%)    MCV 87.6  78.0 - 100.0 (fL)    MCH 30.7  26.0 - 34.0 (pg)    MCHC 35.1  30.0 - 36.0 (g/dL)    RDW 04.5 (*) 40.9 - 15.5 (%)    Platelets 175  150 - 400 (K/uL)   DIFFERENTIAL     Status: Abnormal   Collection Time   06/10/11 10:03 PM      Component Value Range Comment   Neutrophils Relative 84 (*) 43 - 77 (%)    Lymphocytes Relative 6 (*) 12 - 46 (%)    Monocytes Relative 9  3 - 12 (%)    Eosinophils Relative 1  0 - 5 (%)    Basophils Relative 0  0 - 1 (%)    Neutro Abs 7.9 (*) 1.7 - 7.7 (K/uL)    Lymphs Abs 0.6 (*) 0.7 - 4.0 (K/uL)    Monocytes Absolute 0.8  0.1 - 1.0 (K/uL)    Eosinophils Absolute 0.1  0.0 - 0.7 (K/uL)    Basophils Absolute 0.0  0.0 - 0.1 (K/uL)    RBC Morphology ACANTHOCYTES   BURR CELLS   WBC Morphology TOXIC GRANULATION      Smear Review LARGE PLATELETS PRESENT     POCT I-STAT, CHEM 8     Status: Abnormal   Collection Time    06/10/11 10:05 PM      Component Value Range Comment   Sodium 128 (*) 135 - 145 (mEq/L)    Potassium 4.4  3.5 - 5.1 (mEq/L)    Chloride 106  96 - 112 (mEq/L)    BUN 69 (*) 6 - 23 (mg/dL)    Creatinine, Ser 8.11 (*) 0.50 - 1.35 (mg/dL)    Glucose, Bld 914 (*) 70 - 99 (mg/dL)    Calcium, Ion 7.82 (*) 1.12 - 1.32 (mmol/L)    TCO2 16  0 - 100 (mmol/L)    Hemoglobin 9.5 (*) 13.0 - 17.0 (g/dL)    HCT 95.6 (*) 21.3 - 52.0 (%)   URINALYSIS, ROUTINE  W REFLEX MICROSCOPIC     Status: Abnormal   Collection Time   06/10/11 10:40 PM      Component Value Range Comment   Color, Urine YELLOW  YELLOW     APPearance CLOUDY (*) CLEAR     Specific Gravity, Urine 1.018  1.005 - 1.030     pH 6.0  5.0 - 8.0     Glucose, UA NEGATIVE  NEGATIVE (mg/dL)    Hgb urine dipstick LARGE (*) NEGATIVE     Bilirubin Urine NEGATIVE  NEGATIVE     Ketones, ur NEGATIVE  NEGATIVE (mg/dL)    Protein, ur 30 (*) NEGATIVE (mg/dL)    Urobilinogen, UA 0.2  0.0 - 1.0 (mg/dL)    Nitrite NEGATIVE  NEGATIVE     Leukocytes, UA MODERATE (*) NEGATIVE    URINE MICROSCOPIC-ADD ON     Status: Abnormal   Collection Time   06/10/11 10:40 PM      Component Value Range Comment   Squamous Epithelial / LPF FEW (*) RARE     WBC, UA TOO NUMEROUS TO COUNT  <3 (WBC/hpf)    RBC / HPF TOO NUMEROUS TO COUNT  <3 (RBC/hpf)    Bacteria, UA MANY (*) RARE     Urine-Other MUCOUS PRESENT       Radiological Exams on Admission: No results found.  Assessment/Plan 1. ARF: Multifactorial, need to rule out obstruction we'll get a renal ultrasound stat, Also secondary to NSAIDs and ACE inhibitor Foley catheter in, strict I.'s and O.'s, daily weights Check SPEP Will need renal consult in a.m. 2. metastatic prostate cancer, lost to followup PSA significantly elevated back in 10/12 Not on active treatment, continue Flomax Will need close followup with urology, inpatient if there is evidence of obstruction based on ultrasound Add oxycodone for pain  control 3. UTI: Check urine cultures, start IV Rocephin 4. Hypertension: Continue Toprol, hold ACE 5. DVT prophylaxis with Lovenox 6. CODE STATUS discussed the patient DO NOT RESUSCITATE  Time Spent on Admission:  Oleg Oleson [facility] Pager: [phone] 06/11/2011, 12:36 AM

## 2011-06-12 ENCOUNTER — Inpatient Hospital Stay (HOSPITAL_COMMUNITY): Payer: Medicare HMO

## 2011-06-12 DIAGNOSIS — Z8546 Personal history of malignant neoplasm of prostate: Secondary | ICD-10-CM

## 2011-06-12 DIAGNOSIS — E782 Mixed hyperlipidemia: Secondary | ICD-10-CM

## 2011-06-12 DIAGNOSIS — R5383 Other fatigue: Secondary | ICD-10-CM

## 2011-06-12 DIAGNOSIS — R5381 Other malaise: Secondary | ICD-10-CM

## 2011-06-12 DIAGNOSIS — R634 Abnormal weight loss: Secondary | ICD-10-CM

## 2011-06-12 DIAGNOSIS — C7952 Secondary malignant neoplasm of bone marrow: Secondary | ICD-10-CM

## 2011-06-12 DIAGNOSIS — M545 Low back pain: Secondary | ICD-10-CM

## 2011-06-12 DIAGNOSIS — N179 Acute kidney failure, unspecified: Secondary | ICD-10-CM

## 2011-06-12 DIAGNOSIS — C7951 Secondary malignant neoplasm of bone: Secondary | ICD-10-CM

## 2011-06-12 LAB — BASIC METABOLIC PANEL
BUN: 25 mg/dL — ABNORMAL HIGH (ref 6–23)
Creatinine, Ser: 1.74 mg/dL — ABNORMAL HIGH (ref 0.50–1.35)
GFR calc Af Amer: 38 mL/min — ABNORMAL LOW (ref >90)
GFR calc non Af Amer: 33 mL/min — ABNORMAL LOW (ref >90)
Glucose, Bld: 95 mg/dL (ref 70–99)

## 2011-06-12 LAB — CBC
MCH: 31.4 pg (ref 26.0–34.0)
MCHC: 35 g/dL (ref 30.0–36.0)
RDW: 16.9 % — ABNORMAL HIGH (ref 11.5–15.5)

## 2011-06-12 LAB — URINE CULTURE

## 2011-06-12 MED ORDER — MORPHINE SULFATE 15 MG PO TABS
7.5000 mg | ORAL_TABLET | ORAL | Status: DC | PRN
  Administered 2011-06-12: 7.5 mg via ORAL
  Filled 2011-06-12: qty 1

## 2011-06-12 MED ORDER — DIPHENHYDRAMINE HCL 50 MG PO CAPS
50.0000 mg | ORAL_CAPSULE | Freq: Every evening | ORAL | Status: DC | PRN
  Administered 2011-06-12: 50 mg via ORAL
  Filled 2011-06-12: qty 1

## 2011-06-12 MED ORDER — MENTHOL 3 MG MT LOZG
1.0000 | LOZENGE | OROMUCOSAL | Status: DC | PRN
  Filled 2011-06-12: qty 9

## 2011-06-12 MED ORDER — MORPHINE SULFATE ER 15 MG PO TBCR
15.0000 mg | EXTENDED_RELEASE_TABLET | Freq: Two times a day (BID) | ORAL | Status: DC
  Administered 2011-06-12 – 2011-06-15 (×6): 15 mg via ORAL
  Filled 2011-06-12 (×6): qty 1

## 2011-06-12 NOTE — Progress Notes (Signed)
Pt request SW for assistance with establishing POA and Health Care Power of [place]. Pt's nephew at bedside and states he will be over tomorrow after 2:30 and would like to meet with SW.

## 2011-06-12 NOTE — Progress Notes (Signed)
Pt states he does not want further testing, states "I'm 78 years old and have lived a good life". Pt denies being in any discomfort. States he knows he'll be unable to return to his apartment. Very pleasant, alert and oriented x4.

## 2011-06-12 NOTE — Consult Note (Signed)
Consult Note from the Palliative Medicine Team at [facility] Patient RU:[id number] Warren Christensen      DOB: 1919-01-04      JXB:[id number]   Consult Requested by: Toya Smothers NP     PCP: Oliver Barre, MD, MD Reason for Consultation:Clarification of Goals of care and options     Phone Number:[phone]   This NP Lorinda Creed reviewed medical records, received report from team, assessed the patient and then meet at the patient's bedside along with his cousin Leonie Douglas support and verbally designated decision maker  H)# [phone] C)# [phone]  to discuss diagnosis prognosis, GOC, EOL wishes disposition and options.  The patient does not have legal paper work at this time to designate HPOA, however he verbally states that his cousin should make decision for him if he cannot make for himself.  Today he has full capacity to make own decisions.   A detailed discussion was had today regarding advanced directives.  Concepts specific to code status, artifical feeding and hydration, continued IV antibiotics and rehospitalization was had.  The difference between a aggressive medical intervention path  and a palliative comfort care path for this patient at this time was had.  Values and goals of care important to patient and family were attempted to be elicited.  Concept of Hospice and Palliative Care were discussed  Natural trajectory and expectations at EOL were discussed.  Questions and concerns addressed.  Hard Choices booklet left for review. Family encouraged to call with questions or concerns.  PMT will continue to support holistically.  A MOST form was completed  Assessment and Plan: 1. Code Status:DNR/DNI-comfort is main focus of care     -continue fluids and antibiotics until dc plan in place  2. Symptom Control: Morphine ER  15 mg every 12 hours, Morphine IR 7.5 mg every 4 hours prn for breakthru 3. Psycho/Social:emotional support offered to pt and cousin at bedside 4. Spiritual:  chaplain consult, pt has been active in his quest for "the truth", he has written several books on religion 5. Disposition: transfer to Palliative care unit, family is attempting to investigate the possibility of a dc home with private caregivers (if funds can support), if not, a SNF may be their only option.  Ultimately they are hopeful for residential hospice when pt is eligible.  Patient Documents Completed or Given: Document Given Completed  Advanced Directives Pkt    MOST  yes  DNR  yes  Gone from My [facility]    Hard Choices  yes    Brief HPI: 78 yr old with metastatic prostate cancer/osseous metastatic disease, pelvic and lymphadenopathy, ascites, significant wt loss (30 lbs on 2 months per medical records), weakness, urinary retention, overall failure to thrive    ROS: weakness, wt loss, hearing impairment (sensitive to loud noise)    PMH:  Past Medical History  Diagnosis Date  . ALLERGIC RHINITIS 08/12/2006  . BLINDNESS, LEFT EYE 08/12/2006  . DEPRESSION 08/12/2006  . DIVERTICULOSIS, COLON 08/12/2006  . GERD 04/10/2010  . Headache 08/12/2006  . HYPERTENSION 08/12/2006  . LUNG CANCER, HX OF 08/12/2006  . PALPITATIONS, RECURRENT 04/10/2010  . PROSTATE CANCER, HX OF 08/12/2006  . PROSTATE CANCER, METASTATIC 04/11/2010  . SINUSITIS- ACUTE-NOS 04/10/2010  . SKIN CANCER, HX OF 08/12/2006  . URINARY INCONTINENCE 08/12/2006  . Anemia 11/20/2010     PSH: Past Surgical History  Procedure Date  . Removal skin cancer     SHOULDER  . Cataract surgery  RIGHT  . Tonsillectomy    I have reviewed the FH and SH and  If appropriate update it with new information. Allergies  Allergen Reactions  . Sulfonamide Derivatives    Scheduled Meds:   . cefTRIAXone (ROCEPHIN)  IV  1 g Intravenous Q24H  . citalopram  10 mg Oral Daily  . enoxaparin  30 mg Subcutaneous Q24H  . ferrous sulfate  325 mg Oral Q breakfast  . metoprolol  25 mg Oral BID  . pantoprazole  40 mg Oral Q1200  . simvastatin   40 mg Oral q1800  . Tamsulosin HCl  0.4 mg Oral Daily   Continuous Infusions:   . sodium chloride 100 mL/hr at 06/12/11 0737   PRN Meds:.acetaminophen, acetaminophen, menthol-cetylpyridinium, ondansetron (ZOFRAN) IV, ondansetron, oxyCODONE, senna    BP 127/64  Pulse 65  Temp(Src) 97.6 F (36.4 C) (Oral)  Resp 18  Ht 5' 8.5" (1.74 m)  Wt 56 kg (123 lb 7.3 oz)  BMI 18.50 kg/m2  SpO2 97%   PPS: 40%   Intake/Output Summary (Last 24 hours) at 06/12/11 1657 Last data filed at 06/12/11 0900  Gross per 24 hour  Intake 1758.33 ml  Output    600 ml  Net 1158.33 ml                          Stool Softner: senokot  Physical Exam:  General: chronically ill appearing ,frail elderly white male,  NAD HEENT:  Dry mouth membranes Chest:   Diminished in bases  CTA,  CVS: RRR Abdomen:distended, firm, non tender +BS Ext: without edema Neuro:alert and oriented X3, fully engaged  GU: indwelling foley for blood tinged urine with noted small clots  Labs: CBC    Component Value Date/Time   WBC 7.4 06/12/2011 0502   RBC 2.42* 06/12/2011 0502   HGB 7.6* 06/12/2011 0502   HCT 21.7* 06/12/2011 0502   PLT 143* 06/12/2011 0502   MCV 89.7 06/12/2011 0502   MCH 31.4 06/12/2011 0502   MCHC 35.0 06/12/2011 0502   RDW 16.9* 06/12/2011 0502   LYMPHSABS 0.6* 06/10/2011 2203   MONOABS 0.8 06/10/2011 2203   EOSABS 0.1 06/10/2011 2203   BASOSABS 0.0 06/10/2011 2203    BMET    Component Value Date/Time   NA 136 06/12/2011 0502   K 3.7 06/12/2011 0502   CL 108 06/12/2011 0502   CO2 19 06/12/2011 0502   GLUCOSE 95 06/12/2011 0502   BUN 25* 06/12/2011 0502   CREATININE 1.74* 06/12/2011 0502   CALCIUM 8.5 06/12/2011 0502   GFRNONAA 33* 06/12/2011 0502   GFRAA 38* 06/12/2011 0502    CMP     Component Value Date/Time   NA 136 06/12/2011 0502   K 3.7 06/12/2011 0502   CL 108 06/12/2011 0502   CO2 19 06/12/2011 0502   GLUCOSE 95 06/12/2011 0502   BUN 25* 06/12/2011 0502   CREATININE 1.74* 06/12/2011 0502    CALCIUM 8.5 06/12/2011 0502   PROT 5.6* 06/11/2011 0455   ALBUMIN 2.3* 06/11/2011 0455   AST 21 06/11/2011 0455   ALT 17 06/11/2011 0455   ALKPHOS 78 06/11/2011 0455   BILITOT 0.1* 06/11/2011 0455   GFRNONAA 33* 06/12/2011 0502   GFRAA 38* 06/12/2011 0502    Dr Izola Price aware of above  Total time spent on the unit was 120 minutes  Time in 1530  Time out 1730 Greater than 50%  of this time was spent counseling and coordinating  care related to the above assessment and plan.  Lorinda Creed  NP  [phone]

## 2011-06-12 NOTE — Progress Notes (Signed)
SUBJECTIVE: MRI of the lumber spine could not be completed last night.  Results of thoracic MRI and bone scan noted below.  Patient informed me that he does not want to complete MRI.  He also informed me that he feels much better this morning.  Pain is significantly controlled.  He reminds me that he was 78 years old and he does not want active therapy.  He is very concerned with placement following discharge.   MEDICATIONS:  Scheduled:   . cefTRIAXone (ROCEPHIN)  IV  1 g Intravenous Q24H  . citalopram  10 mg Oral Daily  . enoxaparin  30 mg Subcutaneous Q24H  . ferrous sulfate  325 mg Oral Q breakfast  . metoprolol  25 mg Oral BID  . pantoprazole  40 mg Oral Q1200  . simvastatin  40 mg Oral q1800  . Tamsulosin HCl  0.4 mg Oral Daily   Continuous:   . sodium chloride 100 mL/hr at 06/12/11 0737   JXB:JYNWGNFAOZHYQ, acetaminophen, menthol-cetylpyridinium, ondansetron (ZOFRAN) IV, ondansetron, oxyCODONE, senna   PHYSICAL EXAM  Vital signs in last 24 hours: Temp:  [97.6 F (36.4 C)-98.5 F (36.9 C)] 98.5 F (36.9 C) (05/15 0455) Pulse Rate:  [69-75] 70  (05/15 0455) Resp:  [18] 18  (05/15 0455) BP: (125-148)/(58-67) 130/58 mmHg (05/15 0455) SpO2:  [95 %-96 %] 95 % (05/15 0455) Weight:  [123 lb 7.3 oz (56 kg)] 123 lb 7.3 oz (56 kg) (05/15 0455)  Intake/Output from previous day: 05/14 0701 - 05/15 0700 In: 2653.3 [P.O.:340; I.V.:2263.3; IV Piggyback:50] Out: 1400 [Urine:1400] Intake/Output this shift: Total I/O In: 120 [P.O.:120] Out: -   General appearance: alert and cooperative.  Looks younger than his stated age.  Resp: clear to auscultation bilaterally.  Decreased air entry in both bases.   Cardio: regular rate and rhythm, S1, S2 normal, no murmur, click, rub or gallop GI: soft, non-tender; bowel sounds normal; no masses,  no organomegaly. Extremities: extremities normal, atraumatic, no cyanosis or edema. Foley draining clear urine.     LABS:  CBC    Component  Value Date/Time   WBC 7.4 06/12/2011 0502   RBC 2.42* 06/12/2011 0502   HGB 7.6* 06/12/2011 0502   HCT 21.7* 06/12/2011 0502   PLT 143* 06/12/2011 0502   MCV 89.7 06/12/2011 0502   MCH 31.4 06/12/2011 0502   MCHC 35.0 06/12/2011 0502   RDW 16.9* 06/12/2011 0502   LYMPHSABS 0.6* 06/10/2011 2203   MONOABS 0.8 06/10/2011 2203   EOSABS 0.1 06/10/2011 2203   BASOSABS 0.0 06/10/2011 2203     Basename 06/12/11 0502 06/11/11 0455  NA 136 134*  K 3.7 3.9  CL 108 103  CO2 19 19  GLUCOSE 95 102*  BUN 25* 56*  CREATININE 1.74* 5.26*  CALCIUM 8.5 9.1     XRAYS/RESULTS: Ct Abdomen Pelvis Wo Contrast  06/11/2011   CT ABDOMEN AND PELVIS WITHOUT CONTRAST    Findings: The lung bases demonstrate a moderate sized right pleural effusion with overlying atelectasis.  There is also a small left effusion.  The unenhanced appearance of the liver is grossly normal.  No obvious lesions or biliary dilatation.  The gallbladder is normal. No definite common bile duct dilatation.  The pancreas is unremarkable.  The spleen is normal in size.  No focal lesions. The adrenal glands are unremarkable.  There is mild bilateral hydroureteronephrosis along with right-sided renal cysts.  There is bulky mesenteric and retroperitoneal lymphadenopathy. There may be the compression of the ureters causing the  ureteral dilatation.  There is also bulky pelvic lymphadenopathy, greater on the left side.  The aorta demonstrates moderate atherosclerotic changes.  No aneurysm.  The bladder has a Foley catheter within.  Mild diffuse bladder wall thickening is noted.  The prostate gland is normal in size but somewhat irregular.  The right seminal vesicle is enlarged and irregular.  This is worrisome for tumor.  There is mesenteric edema and mild ascites.  Examination of the bony structures demonstrates mixed lytic and sclerotic metastatic bone disease involving the spine and pelvis lower rib involvement is also noted.  No obvious pathologic fracture.   IMPRESSION:  1.  Osseous metastatic disease.  No obvious pathologic fracture. 2.  Bulky pelvic and abdominal lymphadenopathy consistent with metastatic disease. 3.  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 4.  Ascites and mesenteric edema. 5.  Mild bilateral hydroureteronephrosis, right greater than left possibly due to compression by adenopathy.  Original Report Authenticated By: P. Loralie Champagne, M.D.    Mr Thoracic Spine Wo Contrast  06/12/2011  Findings: Sagittal imaging only was achieved.  There are technical difficulties with the MR unit tip in the patient's tolerance precluded further imaging.  I think there is a plan to rescan the lumbar region later.  There is mild spinal curvature.  Scout view in the cervical region shows metastatic deposits affecting many of the cervical vertebral bodies, probably C2, C3, C5 and C6.  I do not see evidence of pathologic fracture or canal encroachment.  This was not studied in detail.  In the thoracic region, there is metastatic disease throughout the spine affecting every level. Many of the vertebral bodies show near complete marrow replacement, particularly notable at T1, T2, T3, T4, T6, T7, T9 and T10.  There is no pathologic compression fracture. The spinal canal does not show any encroachment by tumor. There is some extraosseous tumor on the left related to the pedicles, transverse processes and ribs from T1 through T9 and at T11.  This encroaches somewhat upon the intervertebral foramen on the left at those levels and at the T11-12.  IMPRESSION: Only sagittal T1 and T2-weighted imaging was obtained.  Metastatic disease throughout the thoracic region.  No evidence of pathologic compression fracture.  No tumor encroachment of the spinal canal resulting in cord compression.  Some extraosseous tumor on the left affecting the pedicles, transverse processes and ribs with mild encroachment upon the foramina from T1-2 through  T9-10.  There is mild involvement of the foramen on the left at T11-12 as well.  It would be worthwhile to be some axial imaging of this region when the patient returns for the lumbar exam.  Original Report Authenticated By: Thomasenia Sales, M.D.   Nm Bone Scan Whole Body  06/11/2011   NUCLEAR MEDICINE WHOLE BODY BONE SCINTIGRAPHY    Findings: As demonstrated on prior CTs, there is multifocal osseous metastatic disease with involvement of multiple ribs and the thoracolumbar spine.  Spinal involvement is most prominent at L2 and L4.  There is multifocal pelvic involvement with asymmetric involvement of the left superior acetabulum.  No appreciable activity is seen within the appendicular skeleton.  Activity overlapping the proximal left forearm is probably related to the injection site.  The soft tissue activity is within normal limits.  IMPRESSION: Widespread osseous metastatic disease to the ribs, spine and pelvis.  Original Report Authenticated By: Gerrianne Scale, M.D.     ASSESSMENT and PLAN: 39.  78 year old  man with Stage IV prostate cancer with  Bone and osseous metastasis.  S/p Firmagon. Feels better.  I will not be recommending active chemotherapy and palliative care would be most appropriate. The patient is in firm agreement.  Suggest palliative care consult with discharge planning. 2.  Anemia - obtain iron studies. 3. Renal insufficiency - improved.   Arlan Organ I., MD 06/12/2011

## 2011-06-12 NOTE — Care Management Note (Unsigned)
    Page 1 of 1   06/12/2011     1:16:43 PM   CARE MANAGEMENT NOTE 06/12/2011  Patient:  Warren Christensen, Warren Christensen   Account Number:  [id number]  Date Initiated:  06/12/2011  Documentation initiated by:  Lanier Clam  Subjective/Objective Assessment:   ADMITTED W/DIFFICULTY URINATION,ABNORMAL LABS.ARF,UTI.WU:JWJXBJYN CA.DNR.     Action/Plan:   FROM HOME ALONE.CONTACT PERSON COUSIN-LARRY COOPER(COUSIN)CELL#[phone].   Anticipated DC Date:  06/17/2011   Anticipated DC Plan:  SKILLED NURSING FACILITY  In-house referral  Clinical Social Worker      DC Planning Services  CM consult      Choice offered to / List presented to:             Status of service:  In process, will continue to follow Medicare Important Message given?   (If response is "NO", the following Medicare IM given date fields will be blank) Date Medicare IM given:   Date Additional Medicare IM given:    Discharge Disposition:    Per UR Regulation:  Reviewed for med. necessity/level of care/duration of stay  If discussed at Long Length of Stay Meetings, dates discussed:    Comments:  06/12/11 Brit Wernette RN,BSN NCM [phone] ONCOLOGY FOLLOWING.

## 2011-06-12 NOTE — Progress Notes (Signed)
Subjective: Up in chair. Denies pain/discomfort. NAD  Objective: Vital signs Filed Vitals:   06/11/11 0447 06/11/11 1524 06/11/11 2129 06/12/11 0455  BP: 118/70 125/67 148/62 130/58  Pulse: 75 69 75 70  Temp: 98.5 F (36.9 C) 97.6 F (36.4 C) 97.7 F (36.5 C) 98.5 F (36.9 C)  TempSrc: Oral Oral Oral Oral  Resp: 16 18 18 18   Height:      Weight: 55 kg (121 lb 4.1 oz)   56 kg (123 lb 7.3 oz)  SpO2: 95% 96% 96% 95%   Weight change: 0.3 kg (10.6 oz) Last BM Date: 06/11/11  Intake/Output from previous day: 05/14 0701 - 05/15 0700 In: 2653.3 [P.O.:340; I.V.:2263.3; IV Piggyback:50] Out: 1400 [Urine:1400] Total I/O In: 120 [P.O.:120] Out: -    Physical Exam: General: Alert, awake, oriented x3, in no acute distress. Frail  HEENT: No bruits, no goiter. PERRL. Mucus membranes mouth slightly dry.  Heart: Regular rate and rhythm, without murmurs, rubs, gallops. Lungs:Normal effort. Breath sounds clear to auscultation bilaterally. No wheeze Abdomen: Soft,  Somewhat distended, positive bowel sounds. Extremities: No clubbing cyanosis or edema with positive pedal pulses. Neuro: Grossly intact, nonfocal.    Lab Results: Basic Metabolic Panel:  Basename 06/12/11 0502 06/11/11 0455  NA 136 134*  K 3.7 3.9  CL 108 103  CO2 19 19  GLUCOSE 95 102*  BUN 25* 56*  CREATININE 1.74* 5.26*  CALCIUM 8.5 9.1  MG -- --  PHOS -- --   Liver Function Tests:  Basename 06/11/11 0455  AST 21  ALT 17  ALKPHOS 78  BILITOT 0.1*  PROT 5.6*  ALBUMIN 2.3*   No results found for this basename: LIPASE:2,AMYLASE:2 in the last 72 hours No results found for this basename: AMMONIA:2 in the last 72 hours CBC:  Basename 06/12/11 0502 06/11/11 0455 06/10/11 2203  WBC 7.4 7.3 --  NEUTROABS -- -- 7.9*  HGB 7.6* 8.6* --  HCT 21.7* 24.2* --  MCV 89.7 87.1 --  PLT 143* 168 --   Cardiac Enzymes: No results found for this basename: CKTOTAL:3,CKMB:3,CKMBINDEX:3,TROPONINI:3 in the last 72  hours BNP: No results found for this basename: PROBNP:3 in the last 72 hours D-Dimer: No results found for this basename: DDIMER:2 in the last 72 hours CBG: No results found for this basename: GLUCAP:6 in the last 72 hours Hemoglobin A1C: No results found for this basename: HGBA1C in the last 72 hours Fasting Lipid Panel: No results found for this basename: CHOL,HDL,LDLCALC,TRIG,CHOLHDL,LDLDIRECT in the last 72 hours Thyroid Function Tests: No results found for this basename: TSH,T4TOTAL,FREET4,T3FREE,THYROIDAB in the last 72 hours Anemia Panel:  Basename 06/11/11 1400  VITAMINB12 958*  FOLATE 15.2  FERRITIN 336*  TIBC 170*  IRON 12*  RETICCTPCT 1.1   Coagulation: No results found for this basename: LABPROT:2,INR:2 in the last 72 hours Urine Drug Screen: Drugs of Abuse  No results found for this basename: labopia, cocainscrnur, labbenz, amphetmu, thcu, labbarb    Alcohol Level: No results found for this basename: ETH:2 in the last 72 hours Urinalysis:  Basename 06/10/11 2240  COLORURINE YELLOW  LABSPEC 1.018  PHURINE 6.0  GLUCOSEU NEGATIVE  HGBUR LARGE*  BILIRUBINUR NEGATIVE  KETONESUR NEGATIVE  PROTEINUR 30*  UROBILINOGEN 0.2  NITRITE NEGATIVE  LEUKOCYTESUR MODERATE*   Misc. Labs:  Recent Results (from the past 240 hour(s))  URINE CULTURE     Status: Normal   Collection Time   06/11/11  5:40 AM      Component Value Range Status Comment  Specimen Description URINE, CATHETERIZED   Final    Special Requests NONE   Final    Culture  Setup Time [id number]   Final    Colony Count NO GROWTH   Final    Culture NO GROWTH   Final    Report Status 06/12/2011 FINAL   Final     Studies/Results: Ct Abdomen Pelvis Wo Contrast  06/11/2011  *RADIOLOGY REPORT*  Clinical Data: Metastatic prostate cancer.  CT ABDOMEN AND PELVIS WITHOUT CONTRAST  Technique:  Multidetector CT imaging of the abdomen and pelvis was performed following the standard protocol without  intravenous contrast.  Comparison: Chest CT 11/15/2010.  Findings: The lung bases demonstrate a moderate sized right pleural effusion with overlying atelectasis.  There is also a small left effusion.  The unenhanced appearance of the liver is grossly normal.  No obvious lesions or biliary dilatation.  The gallbladder is normal. No definite common bile duct dilatation.  The pancreas is unremarkable.  The spleen is normal in size.  No focal lesions. The adrenal glands are unremarkable.  There is mild bilateral hydroureteronephrosis along with right-sided renal cysts.  There is bulky mesenteric and retroperitoneal lymphadenopathy. There may be the compression of the ureters causing the ureteral dilatation.  There is also bulky pelvic lymphadenopathy, greater on the left side.  The aorta demonstrates moderate atherosclerotic changes.  No aneurysm.  The bladder has a Foley catheter within.  Mild diffuse bladder wall thickening is noted.  The prostate gland is normal in size but somewhat irregular.  The right seminal vesicle is enlarged and irregular.  This is worrisome for tumor.  There is mesenteric edema and mild ascites.  Examination of the bony structures demonstrates mixed lytic and sclerotic metastatic bone disease involving the spine and pelvis lower rib involvement is also noted.  No obvious pathologic fracture.  IMPRESSION:  1.  Osseous metastatic disease.  No obvious pathologic fracture. 2.  Bulky pelvic and abdominal lymphadenopathy consistent with metastatic disease. 3.  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 4.  Ascites and mesenteric edema. 5.  Mild bilateral hydroureteronephrosis, right greater than left possibly due to compression by adenopathy.  Original Report Authenticated By: P. Loralie Champagne, M.D.   Mr Thoracic Spine Wo Contrast  06/12/2011  *RADIOLOGY REPORT*  Clinical Data: Prostate cancer with metastatic disease to the spine.  Renal failure.  MRI  THORACIC SPINE WITHOUT CONTRAST  Technique:  Multiplanar and multiecho pulse sequences of the thoracic spine were obtained without intravenous contrast.  Comparison: Nuclear medicine 06/11/2011.  CT chest 11/15/2010.  Findings: Sagittal imaging only was achieved.  There are technical difficulties with the MR unit tip in the patient's tolerance precluded further imaging.  I think there is a plan to rescan the lumbar region later.  There is mild spinal curvature.  Scout view in the cervical region shows metastatic deposits affecting many of the cervical vertebral bodies, probably C2, C3, C5 and C6.  I do not see evidence of pathologic fracture or canal encroachment.  This was not studied in detail.  In the thoracic region, there is metastatic disease throughout the spine affecting every level. Many of the vertebral bodies show near complete marrow replacement, particularly notable at T1, T2, T3, T4, T6, T7, T9 and T10.  There is no pathologic compression fracture. The spinal canal does not show any encroachment by tumor. There is some extraosseous tumor on the left related to the pedicles, transverse processes and ribs from T1 through  T9 and at T11.  This encroaches somewhat upon the intervertebral foramen on the left at those levels and at the T11-12.  IMPRESSION: Only sagittal T1 and T2-weighted imaging was obtained.  Metastatic disease throughout the thoracic region.  No evidence of pathologic compression fracture.  No tumor encroachment of the spinal canal resulting in cord compression.  Some extraosseous tumor on the left affecting the pedicles, transverse processes and ribs with mild encroachment upon the foramina from T1-2 through T9-10.  There is mild involvement of the foramen on the left at T11-12 as well.  It would be worthwhile to be some axial imaging of this region when the patient returns for the lumbar exam.  Original Report Authenticated By: Thomasenia Sales, M.D.   Nm Bone Scan Whole  Body  06/11/2011  *RADIOLOGY REPORT*  Clinical Data: Metastatic prostate cancer.  NUCLEAR MEDICINE WHOLE BODY BONE SCINTIGRAPHY  Technique:  Whole body anterior and posterior images were obtained approximately 3 hours after intravenous injection of radiopharmaceutical.  Radiopharmaceutical: CURIE TC-MDP TECHNETIUM TC 61M MEDRONATE IV KIT  Comparison: Abdominal pelvic CT 06/11/2011.  Chest CT 11/15/2010. Report from whole body bone scan 01/05/2002 - no longer available for comparison.  Findings: As demonstrated on prior CTs, there is multifocal osseous metastatic disease with involvement of multiple ribs and the thoracolumbar spine.  Spinal involvement is most prominent at L2 and L4.  There is multifocal pelvic involvement with asymmetric involvement of the left superior acetabulum.  No appreciable activity is seen within the appendicular skeleton.  Activity overlapping the proximal left forearm is probably related to the injection site.  The soft tissue activity is within normal limits.  IMPRESSION: Widespread osseous metastatic disease to the ribs, spine and pelvis.  Original Report Authenticated By: Gerrianne Scale, M.D.   US Renal  06/11/2011  *RADIOLOGY REPORT*  Clinical Data: Elevated BUN and creatinine.  Hematuria.  Urinary incontinence.  History of prostate cancer.  RENAL/URINARY TRACT ULTRASOUND COMPLETE  Comparison:  None  Findings:  Right Kidney:  Right kidney measures 10.8 cm length.  There is moderate pyelocaliectasis.  Simple appearing cyst in the lower pole measuring about 4.5 cm diameter.  Left Kidney:  The left kidney measures 10.4 cm length.  Moderate pyelocaliectasis.  No mass lesions identified.  Bladder:  There is a Foley catheter present in the bladder.  The bladder is distended.  This suggests that the Foley catheter is flat or obstructed.  Bilateral urine flow jets are demonstrated on color Doppler imaging.  No bladder wall thickening.  No intraluminal filling defects.  The  prostate gland appears moderately enlarged, measuring 5.5 x 4.7 x 4.5 cm.  There is an impression on the bladder base caused by the prostate gland.  Incidental note of moderate sized right pleural effusion and small amount of ascites around the liver.  IMPRESSION: Moderate bilateral pyelocaliectasis. Distended bladder despite presence of a Foley catheter.  Enlarged prostate gland.  Right pleural effusion.  Mild upper abdominal ascites.  Original Report Authenticated By: Marlon Pel, M.D.    Medications: Scheduled Meds:   . cefTRIAXone (ROCEPHIN)  IV  1 g Intravenous Q24H  . citalopram  10 mg Oral Daily  . enoxaparin  30 mg Subcutaneous Q24H  . ferrous sulfate  325 mg Oral Q breakfast  . metoprolol  25 mg Oral BID  . pantoprazole  40 mg Oral Q1200  . simvastatin  40 mg Oral q1800  . Tamsulosin HCl  0.4 mg Oral Daily   Continuous  Infusions:   . sodium chloride 100 mL/hr at 06/12/11 0737   PRN Meds:.acetaminophen, acetaminophen, menthol-cetylpyridinium, ondansetron (ZOFRAN) IV, ondansetron, oxyCODONE, senna  Assessment/Plan:  Principal Problem:  *ARF (acute renal failure) Active Problems:  UTI (urinary tract infection)  Prostate cancer metastatic to multiple sites 1. ARF: Likely related to obstruction secondary to metastatic prostate ca and also secondary to NSAIDs and ACE inhibitor . Improved. Creatinine 1.7 from 8.8 on admission. Continue foley catheter , strict I.'s and O.'s, daily weights . Holding off on renal consult for now. Appreciate oncology and urology input. 2. metastatic prostate cancer.PSA significantly elevated back in 10/12 .Not on active treatment, continue Flomax . Partial MRI yields widespread osseous mets in ribs, spine, pelvis, enlarged prostate, distended bladder and pyelocaliectasis . Oncology on board. Pt reports not wanting further work up. Will continue pain control Ask for palliative care consult for goals of care and SW for possible plcment.  Add  oxycodone for pain control  3. UTI: Urine cultures no growth to date,  IV Rocephin day #2 4. Hypertension: controled.  Continue Toprol, hold ACE  5. DVT prophylaxis with Lovenox  6. CODE STATUS discussed the patient DO NOT RESUSCITATE       LOS: 2 days   [facility] M 06/12/2011, 11:26 AM

## 2011-06-12 NOTE — Progress Notes (Signed)
Pt transferred to 1326 via wheelchair. Condition stable at transfer. Report called to receiving RN by Doristine Section, RN. Belongings transferred with patient.

## 2011-06-12 NOTE — Progress Notes (Signed)
Palliative Medicine Team consult to confirm goals of care; assess hospice options requested by Toya Smothers NP; spoke with patient at bedside who is alert- oriented to person, place and time; patient informed writer he has lived by himself at an apartment for the last 10+ yrs-he shared he is a [employer], [employer]; never married and his nephew Peyton Najjar is his contact person -he voiced that he has already started thinking about the fact that he may not be able to return to living alone; he gave me the contact number for his nephew Ronney Asters ([phone]) and meeting scheduled for today Wednesday 06/12/11 @ 3:30 pm   Valente David, RN 06/12/2011, 2:44 PM Palliative Medicine Team RN Liaison [phone]

## 2011-06-12 NOTE — Progress Notes (Signed)
Urology Progress Note  Subjective:     No acute urologic events overnight. Tolerating catheter well.  ROS: Negative: chest pain.  Objective:  Patient Vitals for the past 24 hrs:  BP Temp Temp src Pulse Resp SpO2 Weight  06/12/11 0455 130/58 mmHg 98.5 F (36.9 C) Oral 70  18  95 % 56 kg (123 lb 7.3 oz)  06/11/11 2129 148/62 mmHg 97.7 F (36.5 C) Oral 75  18  96 % -  06/11/11 1524 125/67 mmHg 97.6 F (36.4 C) Oral 69  18  96 % -    Physical Exam: General:  No acute distress, awake Cardiovascular:    [x]   S1/S2 present, RRR  []   Irregularly irregular Chest:  CTA-B Abdomen:               []  Soft, appropriately TTP  [x]  Soft, NTTP  []  Soft, appropriately TTP, incision(s) clean/dry/intact  Genitourinary: Foley in place. Decreased penile edema. Foley:  Draining clear yellow urine.    I/O last 3 completed shifts: In: 3088.3 [P.O.:340; I.V.:2698.3; IV Piggyback:50] Out: 4820 [Urine:4820]  Urine output last 24 hours: 1400cc  Recent Labs  Basename 06/12/11 0502 06/11/11 0455   HGB 7.6* 8.6*   WBC 7.4 7.3   PLT 143* 168    Recent Labs  Basename 06/12/11 0502 06/11/11 0455   NA 136 134*   K 3.7 3.9   CL 108 103   CO2 19 19   BUN 25* 56*   CREATININE 1.74* 5.26*   CALCIUM 8.5 9.1   GFRNONAA 33* 9*   GFRAA 38* 10*     No results found for this basename: PT:2,INR:2,APTT:2 in the last 72 hours   No components found with this basename: ABG:2    Length of stay: 2 days.  Assessment: Metastatic prostate cancer. Urinary retention. ARF  Plan: -ARF improving with catheter drainage. -Would continue to observe renal function. Hydronephrosis does not resolve quickly following obstruction and I do not think a stent is indicated at this point. Patient says he would not want a stent. I do not think a stent would be effective in draining the kidney due to the locally advanced nature of the cancer with possible involvement of the SVs. He would likely need nephrostomy tubes if  there was obstruction in the future.    -I informed the patient he will need to seek continued treatment/management of his prostate cancer from the medical oncologist. Appreciate their help in establishing care for the patient.  -I will continue to manage his retention. I explained he will need a catheter exchange in my clinic in 4 weeks and every 4 weeks thereafter.  -He is interested in nursing home placement as he is unsure he can care for himself alone. This could also help with his compliance with follow up.  -Will continue to follow.   Natalia Leatherwood, MD [phone]

## 2011-06-13 DIAGNOSIS — E782 Mixed hyperlipidemia: Secondary | ICD-10-CM

## 2011-06-13 DIAGNOSIS — C7951 Secondary malignant neoplasm of bone: Secondary | ICD-10-CM

## 2011-06-13 DIAGNOSIS — Z8546 Personal history of malignant neoplasm of prostate: Secondary | ICD-10-CM

## 2011-06-13 DIAGNOSIS — C7952 Secondary malignant neoplasm of bone marrow: Secondary | ICD-10-CM

## 2011-06-13 DIAGNOSIS — N179 Acute kidney failure, unspecified: Secondary | ICD-10-CM

## 2011-06-13 LAB — PROTEIN ELECTROPHORESIS, SERUM
Albumin ELP: 48.3 % — ABNORMAL LOW (ref 55.8–66.1)
Alpha-1-Globulin: 11 % — ABNORMAL HIGH (ref 2.9–4.9)
Alpha-2-Globulin: 17.5 % — ABNORMAL HIGH (ref 7.1–11.8)
Beta Globulin: 4.8 % (ref 4.7–7.2)
Gamma Globulin: 12.6 % (ref 11.1–18.8)
M-Spike, %: NOT DETECTED g/dL
Total Protein ELP: 5.2 g/dL — ABNORMAL LOW (ref 6.0–8.3)
Total Protein ELP: 5.3 g/dL — ABNORMAL LOW (ref 6.0–8.3)

## 2011-06-13 LAB — CBC
MCH: 31 pg (ref 26.0–34.0)
MCV: 90.9 fL (ref 78.0–100.0)
Platelets: 155 10*3/uL (ref 150–400)
RBC: 2.32 MIL/uL — ABNORMAL LOW (ref 4.22–5.81)
RDW: 17 % — ABNORMAL HIGH (ref 11.5–15.5)
WBC: 7.4 10*3/uL (ref 4.0–10.5)

## 2011-06-13 LAB — BASIC METABOLIC PANEL
CO2: 21 mEq/L (ref 19–32)
Calcium: 8.4 mg/dL (ref 8.4–10.5)
Chloride: 109 mEq/L (ref 96–112)
Creatinine, Ser: 1.14 mg/dL (ref 0.50–1.35)
GFR calc Af Amer: 63 mL/min — ABNORMAL LOW (ref >90)
Sodium: 137 mEq/L (ref 135–145)

## 2011-06-13 NOTE — Evaluation (Signed)
Physical Therapy Evaluation Patient Details Name: Warren Christensen MRN: [id number] DOB: 1918-11-20 Today's Date: 06/13/2011 Time: 1450-1505 PT Time Calculation (min): 15 min  PT Assessment / Plan / Recommendation Clinical Impression  78 yo male with multiple medical problems who lives alone and has been falling at home.  He would benefit from continued PT at D/C to increase strength and functional mobility safety to allow safe ambutation for short distances with RW    PT Assessment  Patient needs continued PT services    Follow Up Recommendations  Skilled nursing facility    Barriers to Discharge Decreased caregiver support      lEquipment Recommendations  Rolling walker with 5" wheels;Defer to next venue    Recommendations for Other Services OT consult   Frequency Min 3X/week    Precautions / Restrictions Precautions Precautions: Fall Restrictions Weight Bearing Restrictions: No   Pertinent Vitals/Pain Pt with some c/o pain in legs when walking      Mobility  Bed Mobility Bed Mobility: Sit to Supine Sit to Supine: 4: Min assist Details for Bed Mobility Assistance: some assist to bring legs up onto bed Transfers Transfers: Sit to Stand;Stand to Sit Sit to Stand: 4: Min guard Stand to Sit: 4: Min guard Details for Transfer Assistance: needs assist for safety Ambulation/Gait Ambulation/Gait Assistance: 4: Min assist Ambulation Distance (Feet): 150 Feet Assistive device: Rolling walker Ambulation/Gait Assistance Details: pt c/o pain and weakness in legs after about 100 feet. He benefited from use of RW for balance support Gait Pattern: Step-through pattern Gait velocity: decreased Stairs: No Wheelchair Mobility Wheelchair Mobility: No    Exercises     PT Diagnosis: Difficulty walking;Generalized weakness;Abnormality of gait;Acute pain  PT Problem List: Decreased strength;Decreased activity tolerance;Decreased balance;Decreased knowledge of use of DME;Decreased  safety awareness PT Treatment Interventions: DME instruction;Gait training;Functional mobility training;Therapeutic activities;Therapeutic exercise;Patient/family education   PT Goals Acute Rehab PT Goals PT Goal Formulation: With patient Time For Goal Achievement: 06/27/11 Potential to Achieve Goals: Good Pt will go Supine/Side to Sit: with modified independence PT Goal: Supine/Side to Sit - Progress: Goal set today Pt will go Sit to Supine/Side: with modified independence PT Goal: Sit to Supine/Side - Progress: Goal set today Pt will go Sit to Stand: with modified independence PT Goal: Sit to Stand - Progress: Goal set today Pt will Ambulate: >150 feet;with modified independence;with least restrictive assistive device PT Goal: Ambulate - Progress: Goal set today  Visit Information  Last PT Received On: 06/13/11 Assistance Needed: +1    Subjective Data  Subjective: pt concerned about the falls he had been having prior to admission Patient Stated Goal: to walk   Prior Functioning  Home Living Lives With: Alone Available Help at Discharge: Family;Available PRN/intermittently Type of Home: Apartment Home Layout: One level Home Adaptive Equipment: Straight cane Prior Function Level of Independence: Independent with assistive device(s) (had been falling) Communication Communication: No difficulties    Cognition  Overall Cognitive Status: Appears within functional limits for tasks assessed/performed Arousal/Alertness: Awake/alert Orientation Level: Appears intact for tasks assessed Behavior During Session: [facility] for tasks performed    Extremity/Trunk Assessment Right Lower Extremity Assessment RLE ROM/Strength/Tone: Within functional levels (pt with  muscle atrophy) RLE Sensation: WFL - Light Touch;WFL - Proprioception (c/o pain in toes) RLE Coordination: WFL - gross motor Left Lower Extremity Assessment LLE ROM/Strength/Tone: WFL for tasks assessed LLE Sensation: WFL -  Light Touch;WFL - Proprioception (c/o pain in toes) LLE Coordination: WFL - gross motor   Balance Balance Balance  Assessed: Yes Static Sitting Balance Static Sitting - Balance Support: No upper extremity supported Static Sitting - Level of Assistance: 7: Independent Static Standing Balance Static Standing - Balance Support: Bilateral upper extremity supported (on RW) Static Standing - Level of Assistance: 5: Stand by assistance  End of Session PT - End of Session Equipment Utilized During Treatment: Gait belt Activity Tolerance: Patient limited by fatigue Patient left: in bed;with call bell/phone within reach Nurse Communication: Mobility status   Donnetta Hail 06/13/2011, 4:11 PM

## 2011-06-13 NOTE — Progress Notes (Signed)
CSW notarized POA with patient & cousin.   Unice Bailey, LCSWA [phone]

## 2011-06-13 NOTE — Progress Notes (Signed)
Progress Note from the Palliative Medicine Team at [facility]  Subjective: pt awake and oriented yet more lethargic today     Objective: Allergies  Allergen Reactions  . Sulfonamide Derivatives    Scheduled Meds:   . cefTRIAXone (ROCEPHIN)  IV  1 g Intravenous Q24H  . citalopram  10 mg Oral Daily  . enoxaparin  30 mg Subcutaneous Q24H  . metoprolol  25 mg Oral BID  . morphine  15 mg Oral Q12H  . pantoprazole  40 mg Oral Q1200  . DISCONTD: ferrous sulfate  325 mg Oral Q breakfast  . DISCONTD: simvastatin  40 mg Oral q1800  . DISCONTD: Tamsulosin HCl  0.4 mg Oral Daily   Continuous Infusions:   . sodium chloride 100 mL/hr at 06/13/11 1520   PRN Meds:.acetaminophen, acetaminophen, diphenhydrAMINE, menthol-cetylpyridinium, morphine, ondansetron (ZOFRAN) IV, ondansetron, senna, DISCONTD: oxyCODONE  BP 132/58  Pulse 69  Temp(Src) 98.6 F (37 C) (Oral)  Resp 16  Ht 5\' 8"  (1.727 m)  Wt 56 kg (123 lb 7.3 oz)  BMI 18.77 kg/m2  SpO2 94%   PPS:30%  Pain Score:denies presetnly    Intake/Output Summary (Last 24 hours) at 06/13/11 1616 Last data filed at 06/13/11 1519  Gross per 24 hour  Intake 2271.67 ml  Output   1900 ml  Net 371.67 ml      Physical Exam:  General: chronically ill appearing elderly white male,, NAD HEENT:  Dry mucous membranes Chest:   Diminished in bases, CTA CVS: RRR Abdomen:distended, +BS Ext: without edema Neuro:alert and oriented  Labs: CBC    Component Value Date/Time   WBC 7.4 06/13/2011 0325   RBC 2.32* 06/13/2011 0325   HGB 7.2* 06/13/2011 0325   HCT 21.1* 06/13/2011 0325   PLT 155 06/13/2011 0325   MCV 90.9 06/13/2011 0325   MCH 31.0 06/13/2011 0325   MCHC 34.1 06/13/2011 0325   RDW 17.0* 06/13/2011 0325   LYMPHSABS 0.6* 06/10/2011 2203   MONOABS 0.8 06/10/2011 2203   EOSABS 0.1 06/10/2011 2203   BASOSABS 0.0 06/10/2011 2203    BMET    Component Value Date/Time   NA 137 06/13/2011 0325   K 3.7 06/13/2011 0325   CL 109 06/13/2011 0325     CO2 21 06/13/2011 0325   GLUCOSE 98 06/13/2011 0325   BUN 17 06/13/2011 0325   CREATININE 1.14 06/13/2011 0325   CALCIUM 8.4 06/13/2011 0325   GFRNONAA 54* 06/13/2011 0325   GFRAA 63* 06/13/2011 0325    CMP     Component Value Date/Time   NA 137 06/13/2011 0325   K 3.7 06/13/2011 0325   CL 109 06/13/2011 0325   CO2 21 06/13/2011 0325   GLUCOSE 98 06/13/2011 0325   BUN 17 06/13/2011 0325   CREATININE 1.14 06/13/2011 0325   CALCIUM 8.4 06/13/2011 0325   PROT 5.6* 06/11/2011 0455   ALBUMIN 2.3* 06/11/2011 0455   AST 21 06/11/2011 0455   ALT 17 06/11/2011 0455   ALKPHOS 78 06/11/2011 0455   BILITOT 0.1* 06/11/2011 0455   GFRNONAA 54* 06/13/2011 0325   GFRAA 63* 06/13/2011 0325        Assessment and Plan: 1. Code Status:DNR/DNI-comfort is main focus of care      -dc IV fluids/antibiotics      -no further medical diagnostics, interventions or transfusions  2. Symptom Control: pt reports comfort on opresetn pain medications 3. Psycho/Social:emotional support offered to pt and cousin Warren Christensen/ HPOA 4. Spiritual strong spiritual awareness, chaplain consult  in 5. Disposition: Hopeful for residential, will write for choice, pt lived alone prior to admission.  I believe this patient will decline rapidly in parameter of full comfort.  All of above discussed at bedside with now documented HPOA/Warren Christensen present       Patient Documents Completed or Given: Document Given Completed  Advanced Directives Pkt    MOST  yes  DNR  yes  Gone from My Sight    Hard Choices      Time In Time Out Total Time Spent with Patient Total Overall Time  1615 1700 35 45    Greater than 50%  of this time was spent counseling and coordinating care related to the above assessment and plan.   Gildardo Cranker NP  [phone]  1

## 2011-06-13 NOTE — Progress Notes (Addendum)
Patient ID: Warren Christensen, male   DOB: Mar 11, 1918, 78 y.o.   MRN: [id number]  Subjective: No events overnight. Patient denies chest pain, shortness of breath, abdominal pain.   Objective:  Vital signs in last 24 hours:  Filed Vitals:   06/12/11 0455 06/12/11 1432 06/12/11 2039 06/13/11 0549  BP: 130/58 127/64 157/70 133/61  Pulse: 70 65 76 69  Temp: 98.5 F (36.9 C) 97.6 F (36.4 C) 98.6 F (37 C) 98.6 F (37 C)  TempSrc: Oral Oral Oral Oral  Resp: 18 18 16 16   Height:   5\' 8"  (1.727 m)   Weight: 56 kg (123 lb 7.3 oz)     SpO2: 95% 97% 96% 94%    Intake/Output from previous day:   Intake/Output Summary (Last 24 hours) at 06/13/11 0930 Last data filed at 06/13/11 0900  Gross per 24 hour  Intake 1231.67 ml  Output   1700 ml  Net -468.33 ml    Physical Exam: General: Alert, awake, oriented x3, in no acute distress. Cachectic HEENT: No bruits, no goiter. Moist mucous membranes, no scleral icterus, no conjunctival pallor. Heart: Regular rate and rhythm, S1/S2 +, no murmurs, rubs, gallops. Lungs: Clear to auscultation bilaterally. No wheezing, no rhonchi, no rales.  Abdomen: Soft, nontender, nondistended, positive bowel sounds. Extremities: No clubbing or cyanosis, no pitting edema,  positive pedal pulses. Neuro: Grossly nonfocal.  Lab Results:   Lab 06/13/11 0325 06/12/11 0502 06/11/11 0455 06/10/11 2205 06/10/11 2203  WBC 7.4 7.4 7.3 -- 9.4  HGB 7.2* 7.6* 8.6* 9.5* 9.4*  HCT 21.1* 21.7* 24.2* 28.0* 26.8*  PLT 155 143* 168 -- 175    Lab 06/13/11 0325 06/12/11 0502 06/11/11 0455 06/10/11 2205  NA 137 136 134* 128*  K 3.7 3.7 3.9 4.4  CL 109 108 103 106  CO2 21 19 19  --  GLUCOSE 98 95 102* 132*  BUN 17 25* 56* 69*  CREATININE 1.14 1.74* 5.26* 8.80*  CALCIUM 8.4 8.5 9.1 --    Recent Results (from the past 240 hour(s))  URINE CULTURE     Status: Normal   Collection Time   06/11/11  5:40 AM      Component Value Range Status Comment   Specimen Description  URINE, CATHETERIZED   Final    Special Requests NONE   Final    Culture  Setup Time [id number]   Final    Colony Count NO GROWTH   Final    Culture NO GROWTH   Final    Report Status 06/12/2011 FINAL   Final     Studies/Results: Ct Abdomen Pelvis Wo Contrast 06/11/2011   IMPRESSION:   1.  Osseous metastatic disease.  No obvious pathologic fracture.  2.  Bulky pelvic and abdominal lymphadenopathy consistent with metastatic disease.  3.  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  4.  Ascites and mesenteric edema.  5.  Mild bilateral hydroureteronephrosis, right greater than left possibly due to compression by adenopathy.  .   Mr Thoracic Spine Wo Contrast 06/12/2011    IMPRESSION:  Only sagittal T1 and T2-weighted imaging was obtained.  Metastatic disease throughout the thoracic region.  No evidence of pathologic compression fracture.  No tumor encroachment of the spinal canal resulting in cord compression.  Some extraosseous tumor on the left affecting the pedicles, transverse processes and ribs with mild encroachment upon the foramina from T1-2 through T9-10.  There is mild involvement of the foramen on the left at  T11-12 as well.  It would be worthwhile to be some axial imaging of this region when the patient returns for the lumbar exam.    Nm Bone Scan Whole Body 06/11/2011  MPRESSION:  Widespread osseous metastatic disease to the ribs, spine and pelvis.    Medications: Scheduled Meds:   . cefTRIAXone (ROCEPHIN)  IV  1 g Intravenous Q24H  . citalopram  10 mg Oral Daily  . enoxaparin  30 mg Subcutaneous Q24H  . metoprolol  25 mg Oral BID  . morphine  15 mg Oral Q12H  . pantoprazole  40 mg Oral Q1200  . DISCONTD: ferrous sulfate  325 mg Oral Q breakfast  . DISCONTD: simvastatin  40 mg Oral q1800  . DISCONTD: Tamsulosin HCl  0.4 mg Oral Daily   Continuous Infusions:   . sodium chloride 100 mL/hr at 06/12/11 0737   PRN  Meds:.acetaminophen, acetaminophen, diphenhydrAMINE, menthol-cetylpyridinium, morphine, ondansetron (ZOFRAN) IV, ondansetron, senna, DISCONTD: oxyCODONE  Assessment/Plan:  Principal Problem:  *ARF (acute renal failure)  - kely related to obstruction secondary to metastatic prostate ca and also secondary to NSAIDs and ACE inhibitor . - Improved. Creatinine now within normal limits - will need cath changed every 4-6 weeks as per urology recommendations - strict I.'s and O.'s, daily weights .   Active Problems:  UTI (urinary tract infection)  - urine cultures show no growth to date - will continue Rocephin  Prostate cancer metastatic to multiple sites  - metastatic prostate cancer.PSA significantly elevated back in 10/12 . - Not on active treatment, continue Flomax .  - Partial MRI yields widespread osseous mets in ribs, spine, pelvis, enlarged prostate, distended bladder and pyelocaliectasis .  - Oncology on board. Pt reports not wanting further work up. Will continue pain control  - appreciate palliative care services input.   Anemia - of chronic disease and now Hg down from the admission date - pt does not want any transfusion at this time - CBC in AM  Hypertension - controled. Continue Toprol, hold ACE   DVT prophylaxis  - with Lovenox   CODE STATUS  - discussed the patient DO NOT RESUSCITATE    EDUCATION - test results and diagnostic studies were discussed with patient - patient verbalized the understanding - questions were answered at the bedside and contact information was provided for additional questions or concerns  DISPOSITION - discharge when plan finalized, pt prefers to go home with aid if possible   LOS: 3 days   MAGICK-Warren Christensen 06/13/2011, 9:30 AM  [facility] Pager: [phone]

## 2011-06-13 NOTE — Progress Notes (Signed)
No problems with catheter.   Filed Vitals:   06/13/11 0549  BP: 133/61  Pulse: 69  Temp: 98.6 F (37 C)  Resp: 16   GEN: NAD Abd: soft GU: Foley draining clear yellow urine  A/P: Metastatic prostate cancer. Urinary retention. -I explained to the patient he will need to have his catheter changed every 4-6 weeks.  -He is scheduled in my clinic for this on 07/11/11 at 8:45 am at [facility]. -Will sign off for now. Please call with questions.

## 2011-06-13 NOTE — Progress Notes (Signed)
CSW assisted pt with notorary. Pt is looking into [facility]. CSW will look into other placement options.  Kayleen Memos. Leighton Ruff [phone]

## 2011-06-14 ENCOUNTER — Telehealth: Payer: Self-pay

## 2011-06-14 DIAGNOSIS — C7952 Secondary malignant neoplasm of bone marrow: Secondary | ICD-10-CM

## 2011-06-14 DIAGNOSIS — Z8546 Personal history of malignant neoplasm of prostate: Secondary | ICD-10-CM

## 2011-06-14 DIAGNOSIS — E782 Mixed hyperlipidemia: Secondary | ICD-10-CM

## 2011-06-14 DIAGNOSIS — C7951 Secondary malignant neoplasm of bone: Secondary | ICD-10-CM

## 2011-06-14 DIAGNOSIS — N179 Acute kidney failure, unspecified: Secondary | ICD-10-CM

## 2011-06-14 LAB — UIFE/LIGHT CHAINS/TP QN, 24-HR UR
Alpha 1, Urine: DETECTED — AB
Free Kappa Lt Chains,Ur: 14.2 mg/dL — ABNORMAL HIGH (ref 0.14–2.42)
Free Kappa/Lambda Ratio: 4.73 ratio (ref 2.04–10.37)
Free Lambda Lt Chains,Ur: 3 mg/dL — ABNORMAL HIGH (ref 0.02–0.67)
Gamma Globulin, Urine: DETECTED — AB
Total Protein, Urine: 43.7 mg/dL

## 2011-06-14 LAB — CBC
MCV: 91.9 fL (ref 78.0–100.0)
Platelets: 167 10*3/uL (ref 150–400)
RBC: 2.6 MIL/uL — ABNORMAL LOW (ref 4.22–5.81)
WBC: 9 10*3/uL (ref 4.0–10.5)

## 2011-06-14 MED ORDER — METOPROLOL TARTRATE 25 MG PO TABS: 25.0000 mg | ORAL_TABLET | Freq: Two times a day (BID) | ORAL | Status: DC

## 2011-06-14 MED ORDER — MORPHINE SULFATE 15 MG PO TABS: 7.5000 mg | ORAL_TABLET | ORAL | Status: AC | PRN

## 2011-06-14 MED ORDER — MORPHINE SULFATE ER 15 MG PO TBCR: 15.0000 mg | EXTENDED_RELEASE_TABLET | Freq: Two times a day (BID) | ORAL | Status: DC

## 2011-06-14 MED ORDER — DIPHENHYDRAMINE HCL 50 MG PO CAPS: 50.0000 mg | ORAL_CAPSULE | Freq: Four times a day (QID) | ORAL | Status: AC | PRN

## 2011-06-14 MED ORDER — SENNA 8.6 MG PO TABS: 1.0000 | ORAL_TABLET | Freq: Every day | ORAL | Status: AC | PRN

## 2011-06-14 NOTE — Progress Notes (Signed)
Progress Note from the Palliative Medicine Team at [facility]  Subjective: pt alert and oriented X3, "feels better"     Objective: Allergies  Allergen Reactions  . Sulfonamide Derivatives    Scheduled Meds:   . citalopram  10 mg Oral Daily  . metoprolol  25 mg Oral BID  . morphine  15 mg Oral Q12H  . pantoprazole  40 mg Oral Q1200  . DISCONTD: cefTRIAXone (ROCEPHIN)  IV  1 g Intravenous Q24H  . DISCONTD: enoxaparin  30 mg Subcutaneous Q24H   Continuous Infusions:   . sodium chloride 100 mL/hr at 06/13/11 1520   PRN Meds:.acetaminophen, acetaminophen, diphenhydrAMINE, menthol-cetylpyridinium, morphine, ondansetron (ZOFRAN) IV, ondansetron, senna  BP 120/61  Pulse 73  Temp(Src) 99.5 F (37.5 C) (Oral)  Resp 16  Ht 5\' 8"  (1.727 m)  Wt 56 kg (123 lb 7.3 oz)  BMI 18.77 kg/m2  SpO2 89%   PPS:30%  Pain Score denies Pain Location   Intake/Output Summary (Last 24 hours) at 06/14/11 1513 Last data filed at 06/14/11 0625  Gross per 24 hour  Intake   1367 ml  Output    801 ml  Net    566 ml      LBM:06-13-11    Stool Softner:senna  Physical Exam:  General: ill appearing, pale, frail  NAD HEENT:  Moist mucous membranes Chest:   CTA CVS: RRR Abdomen:soft NT +BS Ext: without edema Neuro:alert and oriented  Labs: CBC    Component Value Date/Time   WBC 9.0 06/14/2011 0406   RBC 2.60* 06/14/2011 0406   HGB 8.0* 06/14/2011 0406   HCT 23.9* 06/14/2011 0406   PLT 167 06/14/2011 0406   MCV 91.9 06/14/2011 0406   MCH 30.8 06/14/2011 0406   MCHC 33.5 06/14/2011 0406   RDW 17.2* 06/14/2011 0406   LYMPHSABS 0.6* 06/10/2011 2203   MONOABS 0.8 06/10/2011 2203   EOSABS 0.1 06/10/2011 2203   BASOSABS 0.0 06/10/2011 2203    BMET    Component Value Date/Time   NA 137 06/13/2011 0325   K 3.7 06/13/2011 0325   CL 109 06/13/2011 0325   CO2 21 06/13/2011 0325   GLUCOSE 98 06/13/2011 0325   BUN 17 06/13/2011 0325   CREATININE 1.14 06/13/2011 0325   CALCIUM 8.4 06/13/2011 0325   GFRNONAA 54* 06/13/2011 0325   GFRAA 63* 06/13/2011 0325    CMP     Component Value Date/Time   NA 137 06/13/2011 0325   K 3.7 06/13/2011 0325   CL 109 06/13/2011 0325   CO2 21 06/13/2011 0325   GLUCOSE 98 06/13/2011 0325   BUN 17 06/13/2011 0325   CREATININE 1.14 06/13/2011 0325   CALCIUM 8.4 06/13/2011 0325   PROT 5.6* 06/11/2011 0455   ALBUMIN 2.3* 06/11/2011 0455   AST 21 06/11/2011 0455   ALT 17 06/11/2011 0455   ALKPHOS 78 06/11/2011 0455   BILITOT 0.1* 06/11/2011 0455   GFRNONAA 54* 06/13/2011 0325   GFRAA 63* 06/13/2011 0325    Spoke with HPOA, Ronney Asters,  He is aware that patient is not eligible for residential hospice at this time.  Plan is now to secure 24 hr nursng care in the home (private payer) with hospice services.  Choice has been written.  Greater than 50%  of this time was spent counseling and coordinating care related to the above assessment and plan.  Total time spent on the unit was 45 minutes  Time in 1430 time out 1515  Lorinda Creed NP [phone]  1 

## 2011-06-14 NOTE — Progress Notes (Signed)
Mr Fouse was welcoming of a visit from a chaplain.  He has very fundamental, conservative views and is struggling where God is in this illness.  He stated "The [facility] is suppose to comfort me but He hasn't been much of a comfort in the last 5 years."  We discussed this.  I believe that Mr Ghosh is suffering from loneliness also.  He would benefit from further spiritual care support.  We ended the visit with prayer.  He asked me to return this afternoon if I could.  I will attempt to follow-up.   Dellie Catholic  [phone] on-call pager  06/14/11 1252  Clinical Encounter Type  Visited With Patient  Visit Type Spiritual support  Referral From Physician  Spiritual Encounters  Spiritual Needs Emotional;Grief support;Prayer  Stress Factors  Patient Stress Factors Major life changes;Lack of knowledge  Family Stress Factors Not reviewed

## 2011-06-14 NOTE — Discharge Summary (Signed)
Patient ID: ABHIMANYU CRUCES MRN: [id number] DOB/AGE: Mar 15, 1918 78 y.o.  Admit date: 06/10/2011 Discharge date: 06/14/2011  Primary Care Physician:  Oliver Barre, MD, MD  Discharge Diagnoses:  Acute renal failure secondary to urinary retention secondary to prostate cancer obstruction  Present on Admission:  .ARF (acute renal failure) .UTI (urinary tract infection) .Prostate cancer metastatic to multiple sites  Principal Problem:  *ARF (acute renal failure) Active Problems:  UTI (urinary tract infection)  Prostate cancer metastatic to multiple sites   Medication List  As of 06/14/2011  9:26 AM   STOP taking these medications         enalapril 2.5 MG tablet      metoprolol succinate 25 MG 24 hr tablet      simvastatin 40 MG tablet         TAKE these medications         citalopram 10 MG tablet   Commonly known as: CELEXA   Take 1 tablet (10 mg total) by mouth daily.      diphenhydrAMINE 50 MG capsule   Commonly known as: BENADRYL   Take 1 capsule (50 mg total) by mouth every 6 (six) hours as needed for itching or sleep (Give with 1 or 2 Tylenols if pt has not received Tylenol within 4 hours).      ferrous sulfate 325 (65 FE) MG tablet   Take 1 tablet (325 mg total) by mouth daily with breakfast.      metoprolol tartrate 25 MG tablet   Commonly known as: LOPRESSOR   Take 1 tablet (25 mg total) by mouth 2 (two) times daily.      morphine 15 MG tablet   Commonly known as: MSIR   Take 0.5 tablets (7.5 mg total) by mouth every 4 (four) hours as needed (dyspnea).      morphine 15 MG 12 hr tablet   Commonly known as: MS CONTIN   Take 1 tablet (15 mg total) by mouth every 12 (twelve) hours.      omeprazole 20 MG capsule   Commonly known as: PRILOSEC   Take 1 capsule (20 mg total) by mouth daily.      senna 8.6 MG Tabs   Commonly known as: SENOKOT   Take 1 tablet (8.6 mg total) by mouth daily as needed.      Tamsulosin HCl 0.4 MG Caps   Commonly known as: FLOMAX   Take 1 capsule (0.4 mg total) by mouth daily.            Disposition and Follow-up: If needed with PCP, pt wants to continue with comfort care at this time. I explained to the patient he will need to have his catheter changed every 4-6 weeks. He is scheduled in urology clinic for this on 07/11/11 at 8:45 am at [facility].  Consults:  Palliative care, Urology  Significant Diagnostic Studies:   Ct Abdomen Pelvis Wo Contrast 06/11/2011  IMPRESSION:   1.  Osseous metastatic disease.  No obvious pathologic fracture.  2.  Bulky pelvic and abdominal lymphadenopathy consistent with metastatic disease.  3.  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  4.  Ascites and mesenteric edema.  5.  Mild bilateral hydroureteronephrosis, right greater than left possibly due to compression by adenopathy.    Nm Bone Scan Whole Body 06/11/2011    IMPRESSION:  Widespread osseous metastatic disease to the ribs, spine and pelvis.    US Renal 06/11/2011  IMPRESSION:  Moderate bilateral pyelocaliectasis. Distended bladder despite presence of a Foley catheter.  Enlarged prostate gland.  Right pleural effusion.  Mild upper abdominal ascites.   Brief H and P: Mr. Minion is a 79 year old male with metastatic prostate cancer lost to followup, previously followed by Dr. Wanda Plump, presented to [facility] on the day of admission with difficulty urinating, decreased urine output, pain at multiple sites, he subsequently had a Foley catheter placed and labs drawn then went home, was called by his urologist and advised to come to the ER due to renal failure. Patient reports a 30 pound weight loss over the last 2 months, intermittent lower abdominal pain, pain in his lower back, legs for the last couple weeks. He denies any fevers or chills, reports 1 episode of vomiting the day of the admission. On further questioning he reports taking OTC ibuprofen every 4 hours for the  last few days along with Tylenol which hasn't helped his pain much though.  Physical Exam on Discharge:  Filed Vitals:   06/13/11 0549 06/13/11 1102 06/13/11 2200 06/14/11 0624  BP: 133/61 132/58 140/62 120/61  Pulse: 69 69 70 73  Temp: 98.6 F (37 C)   99.5 F (37.5 C)  TempSrc: Oral   Oral  Resp: 16   16  Height:      Weight:      SpO2: 94%   89%     Intake/Output Summary (Last 24 hours) at 06/14/11 0926 Last data filed at 06/14/11 4540  Gross per 24 hour  Intake   1607 ml  Output   1501 ml  Net    106 ml    General: Alert, awake, oriented x3, in no acute distress. cachectic HEENT: No bruits, no goiter. Heart: Regular rate and rhythm, without murmurs, rubs, gallops. Lungs: Clear to auscultation bilaterally.Decreased breath sounds at bases Abdomen: Soft, nontender, nondistended, positive bowel sounds. Extremities: No clubbing cyanosis or edema with positive pedal pulses. Neuro: Grossly intact, nonfocal.  Lab 06/14/11 0406 06/13/11 0325 06/12/11 0502 06/11/11 0455 06/10/11 2205 06/10/11 2203  WBC 9.0 7.4 7.4 7.3 -- 9.4  HGB 8.0* 7.2* 7.6* 8.6* 9.5* --  HCT 23.9* 21.1* 21.7* 24.2* 28.0* --  PLT 167 155 143* 168 -- 175    Lab 06/13/11 0325 06/12/11 0502 06/11/11 0455 06/10/11 2205  NA 137 136 134* 128*  K 3.7 3.7 3.9 4.4  CL 109 108 103 106  CO2 21 19 19  --  GLUCOSE 98 95 102* 132*  BUN 17 25* 56* 69*  CREATININE 1.14 1.74* 5.26* 8.80*  CALCIUM 8.4 8.5 9.1 --     Hospital Course:   Principal Problem:  *ARF (acute renal failure)  - likely related to obstruction secondary to metastatic prostate ca and also secondary to NSAIDs and ACE inhibitor .  - Improved. Creatinine now within normal limits  - will need cath changed every 4-6 weeks as per urology recommendations   Active Problems:  UTI (urinary tract infection)  - urine cultures show no growth to date  - pt has completed the course of antibiotic Rocephin  Prostate cancer metastatic to multiple sites    - metastatic prostate cancer.PSA significantly elevated back in 10/12 .  - Not on active treatment, continue Flomax .  - Partial MRI yields widespread osseous mets in ribs, spine, pelvis, enlarged prostate, distended bladder and pyelocaliectasis .  - Oncology on board. Pt reports not wanting further work up. Will continue pain control  - appreciate palliative care services input.  Anemia  - of chronic disease and now Hg down from the admission date  - pt does not want any transfusion at this time  - CBC shows stable Hg and Hct ~ 8  Hypertension  - controled. Continue Toprol - d/c ACEI upon discharge  CODE STATUS  - discussed the patient DO NOT RESUSCITATE   EDUCATION  - test results and diagnostic studies were discussed with patient  - patient verbalized the understanding  - questions were answered at the bedside and contact information was provided for additional questions or concerns   Time spent on Discharge: Over 30 minutes  Signed: Debbora Presto 06/14/2011, 9:26 AM  [facility], pager #: [phone] Main office number: [phone]

## 2011-06-14 NOTE — Telephone Encounter (Signed)
Ok for both - me as attending, and their md as well for management as well of symptoms

## 2011-06-14 NOTE — Consult Note (Signed)
[facility] Liaison: Received request from CSW 06/13/2011 for interest in [facility]. Chart reviewed and met with patient. Per [employer] staff, Mr. Lengacher not eligible for [facility] due to prognoses likely greater than 6-8 weeks. Understand other options being explored. Will follow until discharge for possible changes. RN and CSW aware. Thank you. Forrestine Him LCSW [phone]

## 2011-06-14 NOTE — Progress Notes (Signed)
Spoke with pt concerning discharge plans.  Pt selected [facility] for Home Hospice.  Referral given to in house rep for [facility]. mp

## 2011-06-14 NOTE — Discharge Instructions (Signed)
Anemia, Nonspecific Your exam and blood tests show you are anemic. This means your blood (hemoglobin) level is low. Normal hemoglobin values are 12 to 15 g/dL for females and 14 to 17 g/dL for males. Make a note of your hemoglobin level today. The hematocrit percent is also used to measure anemia. A normal hematocrit is 38% to 46% in females and 42% to 49% in males. Make a note of your hematocrit level today. CAUSES  Anemia can be due to many different causes.  Excessive bleeding from periods (in women).   Intestinal bleeding.   Poor nutrition.   Kidney, thyroid, liver, and bone marrow diseases.  SYMPTOMS  Anemia can come on suddenly (acute). It can also come on slowly. Symptoms can include:  Minor weakness.   Dizziness.   Palpitations.   Shortness of breath.  Symptoms may be absent until half your hemoglobin is missing if it comes on slowly. Anemia due to acute blood loss from an injury or internal bleeding may require blood transfusion if the loss is severe. Hospital care is needed if you are anemic and there is significant continual blood loss. TREATMENT   Stool tests for blood (Hemoccult) and additional lab tests are often needed. This determines the best treatment.   Further checking on your condition and your response to treatment is very important. It often takes many weeks to correct anemia.  Depending on the cause, treatment can include:  Supplements of iron.   Vitamins B12 and folic acid.   Hormone medicines.If your anemia is due to bleeding, finding the cause of the blood loss is very important. This will help avoid further problems.  SEEK IMMEDIATE MEDICAL CARE IF:   You develop fainting, extreme weakness, shortness of breath, or chest pain.   You develop heavy vaginal bleeding.   You develop bloody or black, tarry stools or vomit up blood.   You develop a high fever, rash, repeated vomiting, or dehydration.  Document Released: 02/22/2004 Document Revised:  01/03/2011 Document Reviewed: 11/29/2008 ExitCare Patient Information 2012 [employer]. 

## 2011-06-14 NOTE — Telephone Encounter (Signed)
Warren Christensen called to notify MD that pt is going to be discharged from hospital to [facility] tomorrow. Hospice requesting [name] to be pt's attending and their MD will manage pt's sxs. Verbal given for same per [name] protocol.

## 2011-06-14 NOTE — Telephone Encounter (Signed)
Ok for verbal 

## 2011-06-15 DIAGNOSIS — C7952 Secondary malignant neoplasm of bone marrow: Secondary | ICD-10-CM

## 2011-06-15 DIAGNOSIS — N179 Acute kidney failure, unspecified: Secondary | ICD-10-CM

## 2011-06-15 DIAGNOSIS — C7951 Secondary malignant neoplasm of bone: Secondary | ICD-10-CM

## 2011-06-15 DIAGNOSIS — Z8546 Personal history of malignant neoplasm of prostate: Secondary | ICD-10-CM

## 2011-06-15 DIAGNOSIS — E782 Mixed hyperlipidemia: Secondary | ICD-10-CM

## 2011-06-15 NOTE — Progress Notes (Signed)
11:30am  Pt discharged to home via car. Mr. Excell Seltzer family member here to take patient home.  Discharged instructions and Rx  reviewed with family member.  States he understands instructions and Rx. [facility] notified of patients d/c. Marney Doctor triage nurse states she will let the nurse making home visits aware of patient's d/c home.  Wynonia Lawman, RN

## 2011-06-15 NOTE — Discharge Summary (Signed)
Patient ID: Warren Christensen MRN: [id number] DOB/AGE: 1918-05-04 78 y.o.  Admit date: 06/10/2011 Discharge date: 06/15/2011  Primary Care Physician:  Warren Barre, MD, MD  Discharge Diagnoses:  Acute renal failure secondary to urinary retention secondary to prostate cancer obstruction  Present on Admission:  .ARF (acute renal failure) .UTI (urinary tract infection) .Prostate cancer metastatic to multiple sites  Principal Problem:  *ARF (acute renal failure) Active Problems:  UTI (urinary tract infection)  Prostate cancer metastatic to multiple sites   Medication List  As of 06/15/2011  8:33 AM   STOP taking these medications         enalapril 2.5 MG tablet      metoprolol succinate 25 MG 24 hr tablet      simvastatin 40 MG tablet         TAKE these medications         citalopram 10 MG tablet   Commonly known as: CELEXA   Take 1 tablet (10 mg total) by mouth daily.      diphenhydrAMINE 50 MG capsule   Commonly known as: BENADRYL   Take 1 capsule (50 mg total) by mouth every 6 (six) hours as needed for itching or sleep (Give with 1 or 2 Tylenols if pt has not received Tylenol within 4 hours).      ferrous sulfate 325 (65 FE) MG tablet   Take 1 tablet (325 mg total) by mouth daily with breakfast.      metoprolol tartrate 25 MG tablet   Commonly known as: LOPRESSOR   Take 1 tablet (25 mg total) by mouth 2 (two) times daily.      morphine 15 MG tablet   Commonly known as: MSIR   Take 0.5 tablets (7.5 mg total) by mouth every 4 (four) hours as needed (dyspnea).      morphine 15 MG 12 hr tablet   Commonly known as: MS CONTIN   Take 1 tablet (15 mg total) by mouth every 12 (twelve) hours.      omeprazole 20 MG capsule   Commonly known as: PRILOSEC   Take 1 capsule (20 mg total) by mouth daily.      senna 8.6 MG Tabs   Commonly known as: SENOKOT   Take 1 tablet (8.6 mg total) by mouth daily as needed.      Tamsulosin HCl 0.4 MG Caps   Commonly known as: FLOMAX   Take 1 capsule (0.4 mg total) by mouth daily.            Disposition and Follow-up: If needed with PCP, pt wants to continue with comfort care at this time. I explained to the patient he will need to have his catheter changed every 4-6 weeks. He is scheduled in urology clinic for this on 07/11/11 at 8:45 am at [facility].  Consults: Palliative care, Urology   Significant Diagnostic Studies:   Ct Abdomen Pelvis Wo Contrast  06/11/2011  IMPRESSION:  1. Osseous metastatic disease. No obvious pathologic fracture.  2. Bulky pelvic and abdominal lymphadenopathy consistent with metastatic disease.  3. 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  4. Ascites and mesenteric edema.  5. Mild bilateral hydroureteronephrosis, right greater than left possibly due to compression by adenopathy.   Nm Bone Scan Whole Body  06/11/2011  IMPRESSION:  Widespread osseous metastatic disease to the ribs, spine and pelvis.   US Renal  06/11/2011  IMPRESSION:  Moderate bilateral pyelocaliectasis. Distended bladder despite presence of a  Foley catheter. Enlarged prostate gland. Right pleural effusion. Mild upper abdominal ascites.   Brief H and P:  Warren Christensen is a 77 year old male with metastatic prostate cancer lost to followup, previously followed by Warren Christensen, presented to [facility] on the day of admission with difficulty urinating, decreased urine output, pain at multiple sites, he subsequently had a Foley catheter placed and labs drawn then went home, was called by his urologist and advised to come to the ER due to renal failure. Patient reports a 30 pound weight loss over the last 2 months, intermittent lower abdominal pain, pain in his lower back, legs for the last couple weeks. He denies any fevers or chills, reports 1 episode of vomiting the day of the admission. On further questioning he reports taking OTC ibuprofen every 4 hours for the last few days  along with Tylenol which hasn't helped his pain much though.  Physical Exam on Discharge:  Filed Vitals:   06/13/11 2200 06/14/11 0624 06/14/11 2140 06/15/11 0553  BP: 140/62 120/61 122/62 113/67  Pulse: 70 73 62 75  Temp:  99.5 F (37.5 C)  98.7 F (37.1 C)  TempSrc:  Oral  Oral  Resp:  16  16  Height:      Weight:      SpO2:  89%  84%    Intake/Output Summary (Last 24 hours) at 06/15/11 0833 Last data filed at 06/15/11 0644  Gross per 24 hour  Intake    507 ml  Output   1451 ml  Net   -944 ml   General: Alert, awake, oriented x3, in no acute distress. cachectic  HEENT: No bruits, no goiter.  Heart: Regular rate and rhythm, without murmurs, rubs, gallops.  Lungs: Clear to auscultation bilaterally.Decreased breath sounds at bases  Abdomen: Soft, nontender, nondistended, positive bowel sounds.  Extremities: No clubbing cyanosis or edema with positive pedal pulses.  Neuro: Grossly intact, nonfocal.   Lab  06/14/11 0406  06/13/11 0325  06/12/11 0502  06/11/11 0455  06/10/11 2205  06/10/11 2203   WBC  9.0  7.4  7.4  7.3  --  9.4   HGB  8.0*  7.2*  7.6*  8.6*  9.5*  --   HCT  23.9*  21.1*  21.7*  24.2*  28.0*  --   PLT  167  155  143*  168  --  175    Lab  06/13/11 0325  06/12/11 0502  06/11/11 0455  06/10/11 2205   NA  137  136  134*  128*   K  3.7  3.7  3.9  4.4   CL  109  108  103  106   CO2  21  19  19   --   GLUCOSE  98  95  102*  132*   BUN  17  25*  56*  69*   CREATININE  1.14  1.74*  5.26*  8.80*   CALCIUM  8.4  8.5  9.1  --    Hospital Course:  Principal Problem:  *ARF (acute renal failure)  - likely related to obstruction secondary to metastatic prostate ca and also secondary to NSAIDs and ACE inhibitor .  - Improved. Creatinine now within normal limits  - will need cath changed every 4-6 weeks as per urology recommendations   Active Problems:  UTI (urinary tract infection)  - urine cultures show no growth to date  - pt has completed the course of  antibiotic Rocephin  Prostate cancer metastatic to multiple sites  - metastatic prostate cancer.PSA significantly elevated back in 10/12 .  - Not on active treatment, continue Flomax .  - Partial MRI yields widespread osseous mets in ribs, spine, pelvis, enlarged prostate, distended bladder and pyelocaliectasis .  - Oncology on board. Pt reports not wanting further work up. Will continue pain control  - appreciate palliative care services input.   Anemia  - of chronic disease and now Hg down from the admission date  - pt does not want any transfusion at this time  - CBC shows stable Hg and Hct ~ 8   Hypertension  - controled. Continue Toprol  - d/c ACEI upon discharge   CODE STATUS  - discussed the patient DO NOT RESUSCITATE   EDUCATION  - test results and diagnostic studies were discussed with patient  - patient verbalized the understanding  - questions were answered at the bedside and contact information was provided for additional questions or concerns    Time spent on Discharge: Over 30 minutes  Signed: Debbora Presto 06/15/2011, 8:33 AM  [facility], pager #: [phone] Main office number: [phone]

## 2011-06-17 NOTE — Progress Notes (Signed)
Pt for D/C today to   home    . Plan transport via EMS. Pt and family are agreeable to plans.  Kayleen Memos. Leighton Ruff [phone]

## 2011-06-17 NOTE — Telephone Encounter (Signed)
Called Hospice left detailed messge informed of MD's verbal ok.

## 2011-06-22 DIAGNOSIS — C61 Malignant neoplasm of prostate: Secondary | ICD-10-CM

## 2011-06-22 DIAGNOSIS — R634 Abnormal weight loss: Secondary | ICD-10-CM

## 2011-06-25 ENCOUNTER — Telehealth: Payer: Self-pay

## 2011-06-25 NOTE — Telephone Encounter (Signed)
Ok to follow for now, monitor for any change in the abrasion to suggest  Cellulitis, but o/w should go to ER if shows any behavioral change, sedation, dizziness, off balance, worsening HA or other unusual symptom

## 2011-06-25 NOTE — Telephone Encounter (Signed)
Toniann Fail with Hospice called to inform the patient fell on 06/21/11 and had abrasion on left forehead. Toniann Fail wanted to inform PCP. Call back number is [phone]

## 2011-06-26 NOTE — Telephone Encounter (Signed)
Toniann Fail with hospice was informed of MD's instructions.

## 2011-07-09 ENCOUNTER — Telehealth: Payer: Self-pay

## 2011-07-09 NOTE — Telephone Encounter (Signed)
Ok to stop the metoprolol  OV in 2wks or sooner if needed

## 2011-07-09 NOTE — Telephone Encounter (Signed)
Called Hospice informed of MD's instructions.

## 2011-07-09 NOTE — Telephone Encounter (Signed)
Hospice went out today.  The patients BP today was 90/60. Most recent BP was 120/80 and 114/68.  The nurse states the patient takes Labetolol 50 mg 1/2 BID. Please advise.

## 2011-07-15 ENCOUNTER — Telehealth: Payer: Self-pay

## 2011-07-15 MED ORDER — FLUCONAZOLE 100 MG PO TABS: 100.0000 mg | ORAL_TABLET | Freq: Every day | ORAL | Status: AC

## 2011-07-15 NOTE — Telephone Encounter (Signed)
Done per emr 

## 2011-07-15 NOTE — Telephone Encounter (Signed)
Hospice (Lexa) called to inform the patient has sore gums and throat.  Thinks may be thrush and has been treated with diflucan previously.  Please advise something to call in. Call back number [phone]

## 2011-07-16 NOTE — Telephone Encounter (Signed)
Called hospice left message that prescription has been sent in.

## 2011-08-02 ENCOUNTER — Telehealth: Payer: Self-pay | Admitting: Internal Medicine

## 2011-08-02 MED ORDER — ONDANSETRON HCL 4 MG PO TABS: 4.0000 mg | ORAL_TABLET | Freq: Three times a day (TID) | ORAL | Status: AC | PRN

## 2011-08-02 NOTE — Telephone Encounter (Signed)
Caller: Lexa/RN with  [facility] , other; PCP: Oliver Barre; CB#: [phone]; ; ; Call regarding Reporting a Fall; Hospice nurse reports pt. had a  Fall without injury on 07/30/11.  Pt has had nausea and vomiting a couple of days (now resolved)and was weak and fell.  Will forward pt. update to Dr. Jonny Ruiz.

## 2011-08-02 NOTE — Telephone Encounter (Signed)
Ok for zofran prn  - done per [employer]

## 2011-08-02 NOTE — Telephone Encounter (Signed)
SPOKE WITH [facility], STATES PATIENT IS OK NOW . WAS GIVEN COMPAZINE ORDERED BY ANOTHER PROVIDER  AND PATIENT IS OK NOW.

## 2011-09-19 DIAGNOSIS — C7951 Secondary malignant neoplasm of bone: Secondary | ICD-10-CM

## 2011-09-19 DIAGNOSIS — C61 Malignant neoplasm of prostate: Secondary | ICD-10-CM

## 2011-09-19 DIAGNOSIS — R634 Abnormal weight loss: Secondary | ICD-10-CM

## 2011-09-19 DIAGNOSIS — C7952 Secondary malignant neoplasm of bone marrow: Secondary | ICD-10-CM

## 2011-10-18 ENCOUNTER — Other Ambulatory Visit: Payer: Self-pay | Admitting: Family Medicine

## 2011-10-24 ENCOUNTER — Telehealth: Payer: Self-pay | Admitting: Internal Medicine

## 2011-10-24 MED ORDER — CIPROFLOXACIN HCL 500 MG PO TABS: 500.0000 mg | ORAL_TABLET | Freq: Two times a day (BID) | ORAL | Status: DC

## 2011-10-24 NOTE — Telephone Encounter (Signed)
Ok for [employer] course

## 2011-10-24 NOTE — Telephone Encounter (Signed)
Scherrie Merritts RN- [facility]osp[facility]  Ginette Otto.  Patient Warren Christensen ; DOB 12/10/1918, PCP Oliver Barre MD.  Nurse to see patient today. He has an Indwelling foley from Prostate Cancer.  Noted  Urine cloudy with sediment thick white.  Probable UTI.  Guidance for U/A culture  or medication.  No symptoms per nurse.  Please advise Nurse.  Scherrie Merritts RN   [phone]

## 2011-10-25 NOTE — Telephone Encounter (Signed)
Called left detailed message with Scherrie Merritts RN with [facility] [place] rx sent in.

## 2011-11-26 ENCOUNTER — Other Ambulatory Visit: Payer: Self-pay | Admitting: Physician Assistant

## 2011-11-28 ENCOUNTER — Telehealth: Payer: Self-pay | Admitting: Internal Medicine

## 2011-11-28 MED ORDER — CEPHALEXIN 500 MG PO CAPS: 500.0000 mg | ORAL_CAPSULE | Freq: Four times a day (QID) | ORAL | Status: DC

## 2011-11-28 NOTE — Telephone Encounter (Signed)
Ok for cephalexin this time - done erx

## 2011-11-28 NOTE — Telephone Encounter (Signed)
Patient informed. 

## 2011-11-28 NOTE — Telephone Encounter (Signed)
Caller: Lexa/Other; Patient Name: Warren Christensen; PCP: Oliver Barre (Adults only); Best Callback Phone Number: [phone]; Hospice Nurse calls noting that she thinks a urinary tract infection- afebrile, urine looks murky, slimly sediment and dark.  Has a catheter.   Was treated about 6 weeks ago with Cipro.  Patient  complains of intermittent low back pain.  Hospice nurse has collected urinalysis with culture and sensitivity.  Triaged using Urinary Symptoms -Male with a disposition to call provider immediately which hospice nurse has done due to current urinary tract instrumentation (catheter).  Is calling to see if provide wants to start patient on antibiotic prior to the return of the urinary results.  Care advice given to nurse who educated patient.  Patient lives alone, but has someone who could pick up a prescription from [facility] on [address] at [phone].    OFFICE: PLEASE FOLLOW UP WITH PATIENT REGARDING STARTING ANTIBIOTIC FOR SUSPECTED URINARY TRACT INFECTION. THANKS

## 2011-12-02 ENCOUNTER — Other Ambulatory Visit: Payer: Self-pay

## 2011-12-02 MED ORDER — TAMSULOSIN HCL 0.4 MG PO CAPS: 0.4000 mg | ORAL_CAPSULE | Freq: Every day | ORAL | Status: DC

## 2011-12-24 ENCOUNTER — Telehealth: Payer: Self-pay

## 2011-12-24 MED ORDER — LEVOFLOXACIN 250 MG PO TABS: 250.0000 mg | ORAL_TABLET | Freq: Every day | ORAL | Status: DC

## 2011-12-24 NOTE — Telephone Encounter (Signed)
[facility] called to inform MD that pt's urine is has been thick, cloudy with sediment. Pt has recent history of frequent urinary tract infections. [facility] is requesting an ABX to treat, please advise

## 2011-12-24 NOTE — Telephone Encounter (Signed)
Ok for UA and cx if possible - 599.0  OK for empiric levaquin   - done erx

## 2011-12-25 NOTE — Telephone Encounter (Signed)
[facility] informed 

## 2011-12-25 NOTE — Telephone Encounter (Signed)
Called Lexa RN Hospice left message to call back

## 2012-01-31 ENCOUNTER — Other Ambulatory Visit: Payer: Self-pay | Admitting: Internal Medicine

## 2012-02-06 ENCOUNTER — Telehealth: Payer: Self-pay | Admitting: *Deleted

## 2012-02-06 NOTE — Telephone Encounter (Signed)
Left msg on triage stating pt been having elevated Bp for couple of weeks now. Along with ringing in his ears. BP been running 140-160/ 90's. Currently not taking anything for BP med was stop long time ago due to low bp. Requesting md advisement...Raechel Chute

## 2012-02-06 NOTE — Telephone Encounter (Signed)
Called left message to call back 

## 2012-02-06 NOTE — Telephone Encounter (Signed)
Pt not seen here since oct 2012  Please make ROV this wk if possible

## 2012-02-06 NOTE — Telephone Encounter (Signed)
Just to be clear  Clarify if pt taking the lopressor

## 2012-02-06 NOTE — Telephone Encounter (Signed)
Spoke to hospice and he has been off lopressor for 6 months due to falling, dizzy spells and low bp. He has no BP at his home at this time.  Hospice is going to fax an updated medication list to our office.

## 2012-02-07 NOTE — Telephone Encounter (Signed)
Received med. List from hospice and did update in the chart.  Called hospice to inform to schedule appt.  But  Hospice stated the patient is home bound and most likely will be unable to come in as advised.

## 2012-03-30 ENCOUNTER — Other Ambulatory Visit: Payer: Self-pay | Admitting: Internal Medicine

## 2012-03-30 MED ORDER — TAMSULOSIN HCL 0.4 MG PO CAPS: ORAL_CAPSULE | ORAL | Status: AC

## 2012-03-30 NOTE — Telephone Encounter (Signed)
Pt lst seen 03/11/10. Please advise if ok to fill?

## 2012-03-30 NOTE — Telephone Encounter (Signed)
Refilled one mo only  Due for ROV - please contact pt

## 2012-03-30 NOTE — Telephone Encounter (Signed)
Pt states does not want to make an appt at this time because he is 89 and cannot get around.

## 2012-03-30 NOTE — Telephone Encounter (Signed)
Done erx 

## 2012-05-11 ENCOUNTER — Other Ambulatory Visit: Payer: Self-pay | Admitting: Family Medicine

## 2012-07-13 ENCOUNTER — Other Ambulatory Visit: Payer: Self-pay | Admitting: Internal Medicine

## 2012-07-16 ENCOUNTER — Telehealth: Payer: Self-pay

## 2012-07-16 MED ORDER — OXYBUTYNIN CHLORIDE 5 MG PO TABS: 5.0000 mg | ORAL_TABLET | Freq: Every day | ORAL | Status: AC

## 2012-07-16 NOTE — Addendum Note (Signed)
Addended by: Scharlene Gloss B on: 07/16/2012 04:37 PM   Modules accepted: Orders

## 2012-07-16 NOTE — Telephone Encounter (Signed)
Hospice would like a refill on Ditropan 5 mg for bladder spasms.  Hospice states the patient would have a difficult time getting to the office for an OV and a refill on this medication would be very helpful.  Hospice RN Maryruth Hancock call back number [phone]

## 2012-07-16 NOTE — Telephone Encounter (Signed)
Hospice informed and sent in rx to pharmacy

## 2012-07-16 NOTE — Telephone Encounter (Signed)
Ok with me - to [place]

## 2012-11-25 ENCOUNTER — Telehealth: Payer: Self-pay

## 2012-11-25 NOTE — Telephone Encounter (Signed)
Hospice RN states the patient has an ingrown toenail.  They have soaked it in epsom salts, applied antibiotic ointment and covered with clean gauze.  RN states she has definitely seen worse as is not significant, but needs instructions by PCP as to how often to do above for toenail.Marland Kitchen

## 2012-11-25 NOTE — Telephone Encounter (Signed)
Ok for tx as detailed bi-weekly until improved, or I can try to refer to podiatry

## 2012-11-25 NOTE — Telephone Encounter (Signed)
[facility] informed 

## 2012-12-02 ENCOUNTER — Telehealth: Payer: Self-pay

## 2012-12-02 NOTE — Telephone Encounter (Signed)
Hospice RN informed of verbal ok.

## 2012-12-02 NOTE — Telephone Encounter (Signed)
Ok for verbal 

## 2012-12-02 NOTE — Telephone Encounter (Signed)
Hospice called to request verbal ok to refill with hospice patients Tamsulosin, without having to have OV in light of patient being in hospice care.  Also to update on ingrown toenail.  Nail has broken off and inflammed.  Please advise if ok to allow hospice to fill tamsulosin.  Call back number [phone]

## 2012-12-08 ENCOUNTER — Telehealth: Payer: Self-pay | Admitting: *Deleted

## 2012-12-08 NOTE — Telephone Encounter (Signed)
Ok for verbal 

## 2012-12-08 NOTE — Telephone Encounter (Signed)
Message from 11.7.14 @ 4:17pm Warren Christensen called in reference to pts left Great Toe infection.  States they have been soaking it in [employer] twice a week, she is requesting to change it 5 days a week.  Please advise

## 2012-12-08 NOTE — Telephone Encounter (Signed)
HHRN informed of verbal ok. 

## 2012-12-09 NOTE — Telephone Encounter (Signed)
HHRN called back today and would like some flexibility on going out to the house only 3 to 5 days if possible for the epsom salts soaks.

## 2012-12-09 NOTE — Telephone Encounter (Signed)
Hospice RN informed (left a detailed message).  She informed Hospice physician has decided to send in an antibiotic for the patient to be on for 10 days for the toe.

## 2012-12-09 NOTE — Telephone Encounter (Signed)
noted 

## 2012-12-09 NOTE — Telephone Encounter (Signed)
Ok for verbal 

## 2013-01-08 ENCOUNTER — Telehealth: Payer: Self-pay

## 2013-01-08 NOTE — Telephone Encounter (Signed)
Hospice RN Ina Homes called to inform they are needing to recertify for hospice due to a gradual decline.   They are going to transfer him to ER [facility] as hydrocodone  Is no longer working.  Hospice MD would like verbal ok by PCP to D/C Simvastatin, loratadine and Celexa.

## 2013-01-08 NOTE — Telephone Encounter (Signed)
Ok for d/c meds as suggested

## 2013-01-08 NOTE — Telephone Encounter (Signed)
Hospice informed  

## 2013-02-03 ENCOUNTER — Telehealth: Payer: Self-pay

## 2013-02-03 NOTE — Telephone Encounter (Signed)
The hospice rn called and wanted to report several issues going on, and was asking for advice on what steps to take next.  She stated the pt's urine had a strong, foul, odor.  She is hoping for an order to do a ua & culture.  She stated the pt was having rt hand muscle cramps, and has lost ten lbs in the past two months.  She mentioned the pt does appear to be weak.   Hospice RN callback - [phone]

## 2013-02-03 NOTE — Telephone Encounter (Signed)
[employer] and culture - 599.0  Not sure about the rest, is there a hospice MD involved?

## 2013-02-04 NOTE — Telephone Encounter (Signed)
Left detailed message on VM UA/Culture order ok with Dx 599.0

## 2013-02-09 ENCOUNTER — Telehealth: Payer: Self-pay | Admitting: *Deleted

## 2013-02-09 ENCOUNTER — Telehealth: Payer: Self-pay | Admitting: Internal Medicine

## 2013-02-09 MED ORDER — CIPROFLOXACIN HCL 250 MG PO TABS: 250.0000 mg | ORAL_TABLET | Freq: Two times a day (BID) | ORAL | Status: AC

## 2013-02-09 NOTE — Telephone Encounter (Signed)
[facility] nurse phoned to make sure that MD office received lab results (their office received them yesterday afternoon) for patient's u/a and c/s.  Please advise.  Also, she wishes to know if MD wants to order any further treatment.   CB# [phone]

## 2013-02-09 NOTE — Telephone Encounter (Signed)
Family member informed 

## 2013-02-09 NOTE — Telephone Encounter (Signed)
Pt under hospice care  Received urine cx results, with + for serratia liquefaciens > 100K  Ok for cipro 250 bid x 10 days - done erx

## 2013-02-09 NOTE — Telephone Encounter (Signed)
Already addressed, I believe robin has already called

## 2013-02-24 ENCOUNTER — Encounter: Payer: Self-pay | Admitting: Internal Medicine

## 2013-03-17 IMAGING — CT CT ANGIO CHEST
1 of 4 series · 18 of 46 positions shown · IV contrast (APPLIED)
Comparison: Chest radiographs 11/15/2010.  Report from chest CT
12/16/2001.

CLINICAL DATA: Chest pain.  History of prostate cancer with
elevated D-dimer levels.  Question pulmonary embolism.

CT ANGIOGRAPHY CHEST WITH CONTRAST
TECHNIQUE: Multidetector CT imaging of the chest was performed
using the standard protocol during bolus administration of
intravenous contrast.  Multiplanar CT image reconstructions
including MIPs were obtained to evaluate the vascular anatomy.
Contrast: 80mL OMNIPAQUE IOHEXOL 300 MG/ML IV SOLN

[Series 5: thins for pacs · axial · 0.62mm/px · z∈[-278,-6]mm · 18 of 298 slices shown]
[im 13/298  lung]
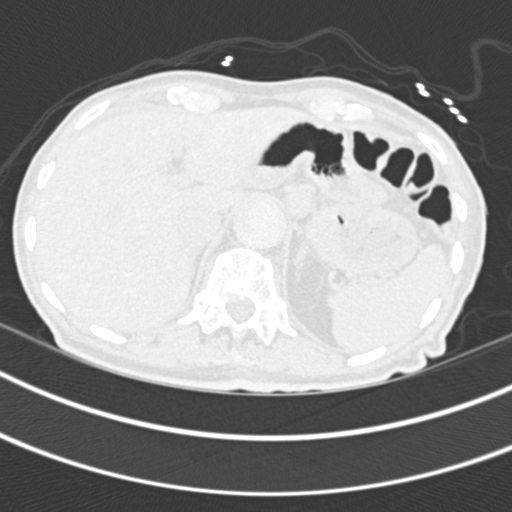
[im 26/298  soft-tissue]
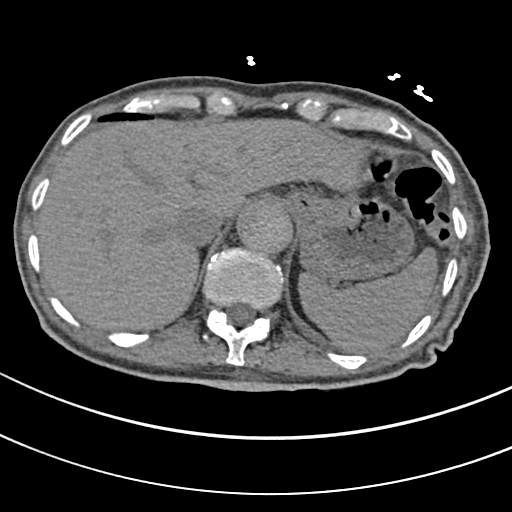
[im 52/298  lung]
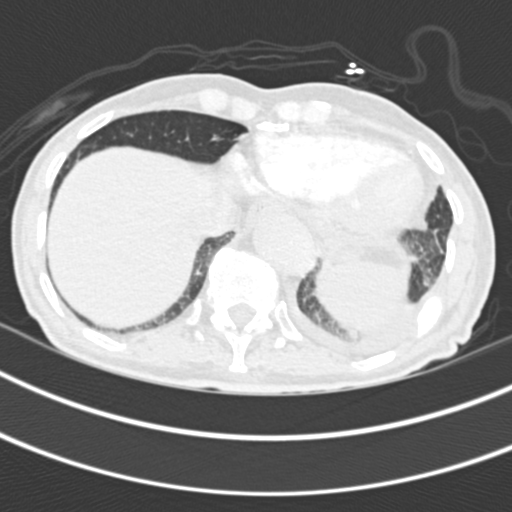
[im 65/298  soft-tissue]
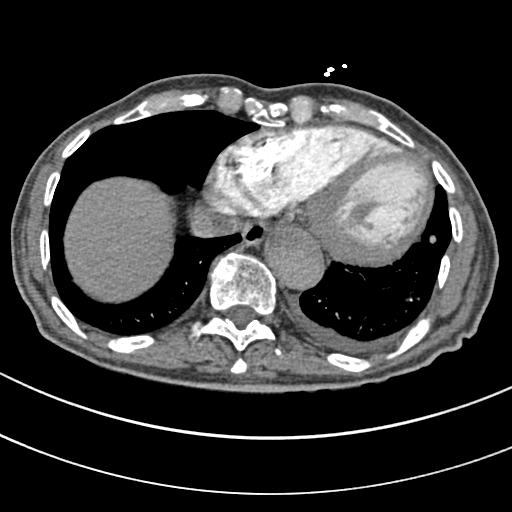
[im 78/298  lung]
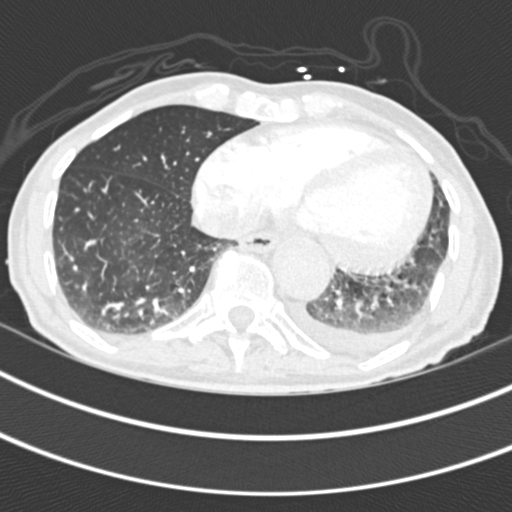
[im 91/298  soft-tissue]
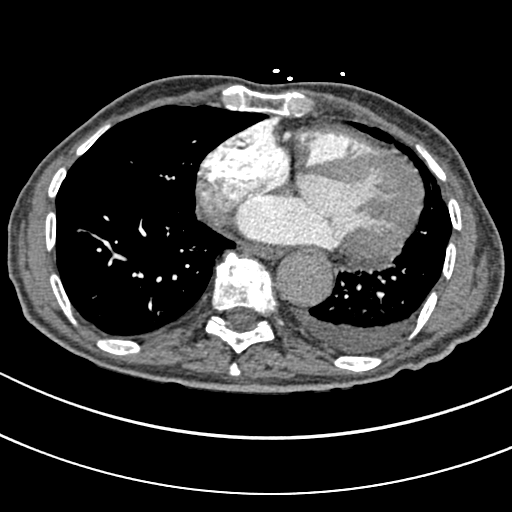
[im 104/298  lung]
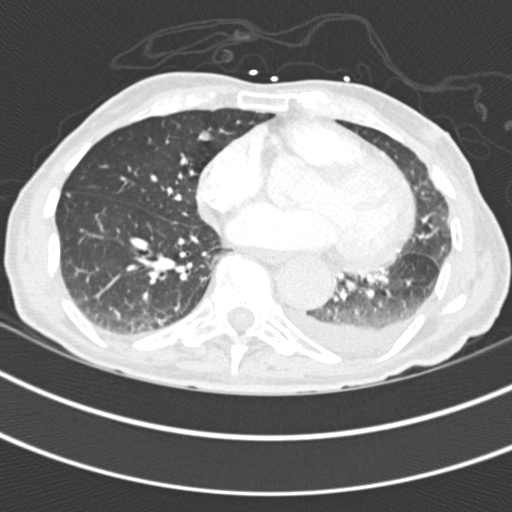
[im 130/298  soft-tissue]
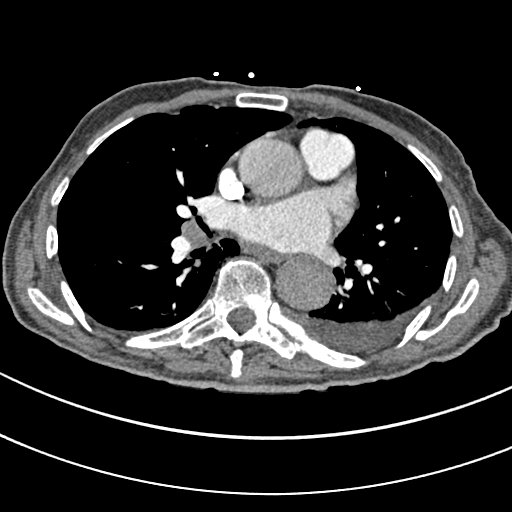
[im 143/298  lung]
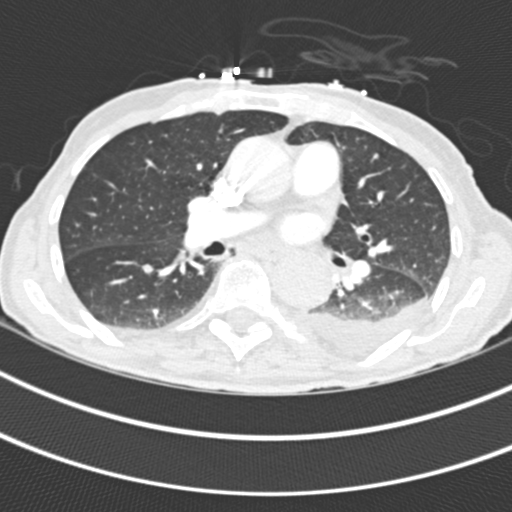
[im 155/298  soft-tissue]
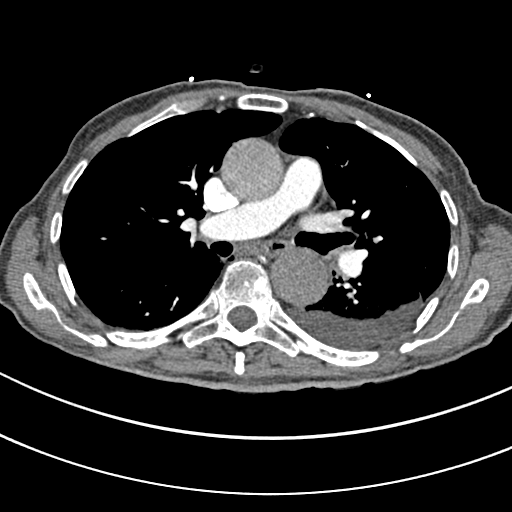
[im 168/298  lung]
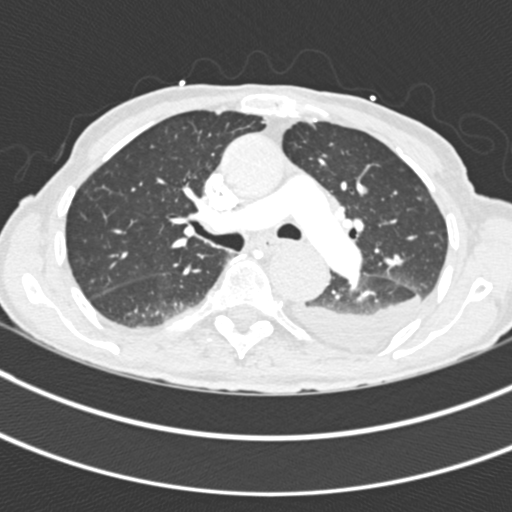
[im 194/298  soft-tissue]
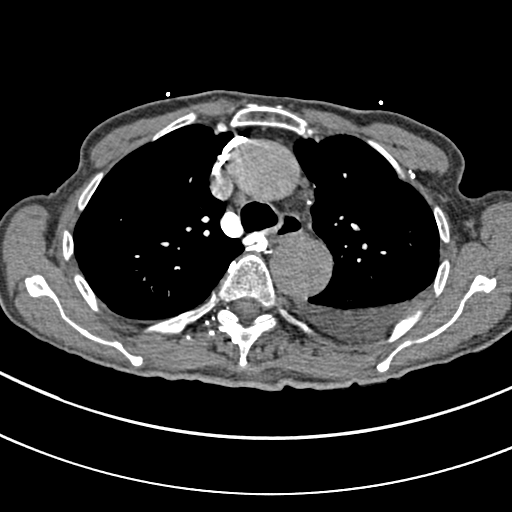
[im 207/298  lung]
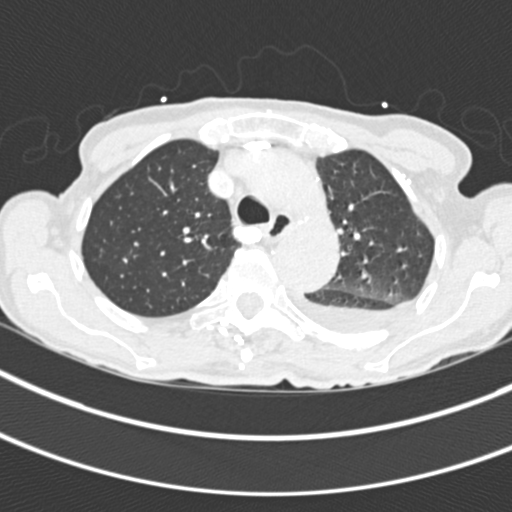
[im 220/298  soft-tissue]
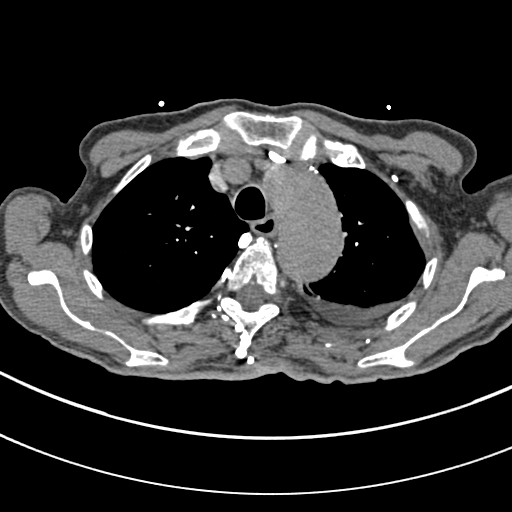
[im 233/298  lung]
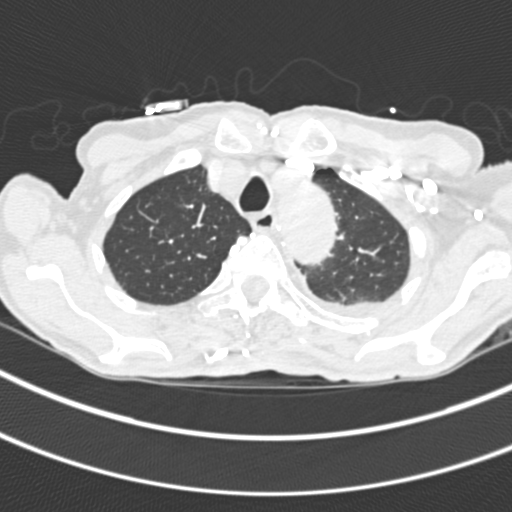
[im 246/298  soft-tissue]
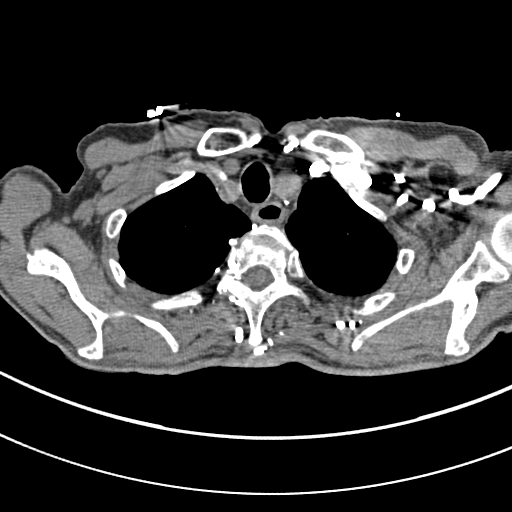
[im 272/298  lung]
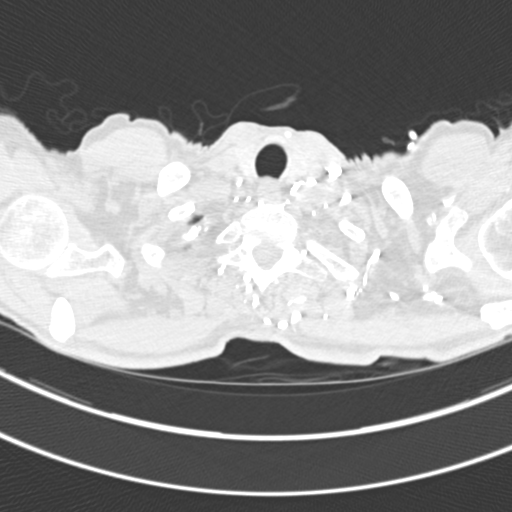
[im 285/298  soft-tissue]
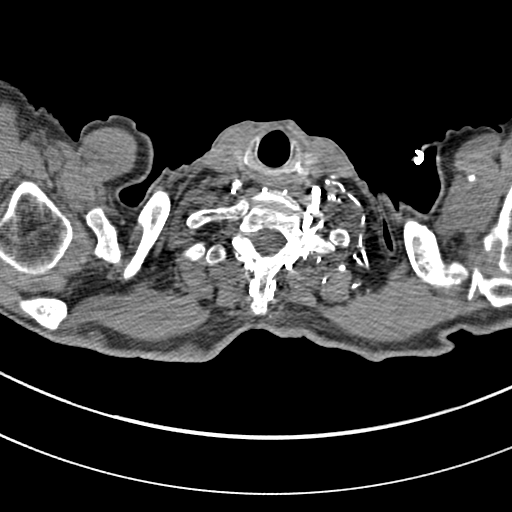

[18 of 46 positions shown; findings below may reference images not displayed]

FINDINGS: The pulmonary arteries are well opacified with contrast.
There is no evidence of acute pulmonary embolism.  The thoracic
aorta appears mildly ectatic without focal aneurysm.

In the right infrahilar region is a well-circumscribed 1.8 cm soft
tissue structure on image 58 of series 4, likely an infrahilar
lymph node.  No enlarged mediastinal lymph nodes are present.
There is a small left pleural effusion.  There is no right pleural
or pericardial effusion.

Multiple pulmonary nodules are present bilaterally.  Many of these
are centrally located adjacent to vascular structures and may
reflect intrapulmonary lymph nodes.  Small aneurysms of the
pulmonary artery branches are difficult to completely exclude.  The
largest measures 7 mm in the right middle lobe on image 65.  There
is mild patchy left lower lobe atelectasis.  No consolidation or
endobronchial lesion is identified.

The visualized upper abdomen appears unremarkable aside from a
probable small cyst in the left hepatic lobe.  Multiple irregular
sclerotic lesions are suspicious for metastatic disease.  These are
most prominent within the T2 and T9 vertebral bodies and the left
second and seventh ribs.

Review of the MIP images confirms the above findings.
IMPRESSION: 1.  No evidence of acute pulmonary embolism.
2.  Multifocal osseous metastatic disease.  Correlation with serum
PSA levels recommended.
3.  Prominent right infrahilar lymph node and multiple central
pulmonary nodules are noted, possibly reflecting intrapulmonary
lymph nodes.  Pulmonary metastases are not completely excluded.  As
many of these are adjacent to vascular structures, it is difficult
to exclude small aneurysms of the pulmonary artery branches as
well.  CT follow-up suggested to assess stability.
4.  Small left pleural effusion.

## 2013-03-17 IMAGING — CR DG CHEST 2V
2 series · 2 of 2 positions shown · non-contrast
Comparison: None.

CLINICAL DATA: Left-sided chest pain.  Shortness of breath.
Prostate carcinoma.

CHEST - 2 VIEW

[w chest pa]
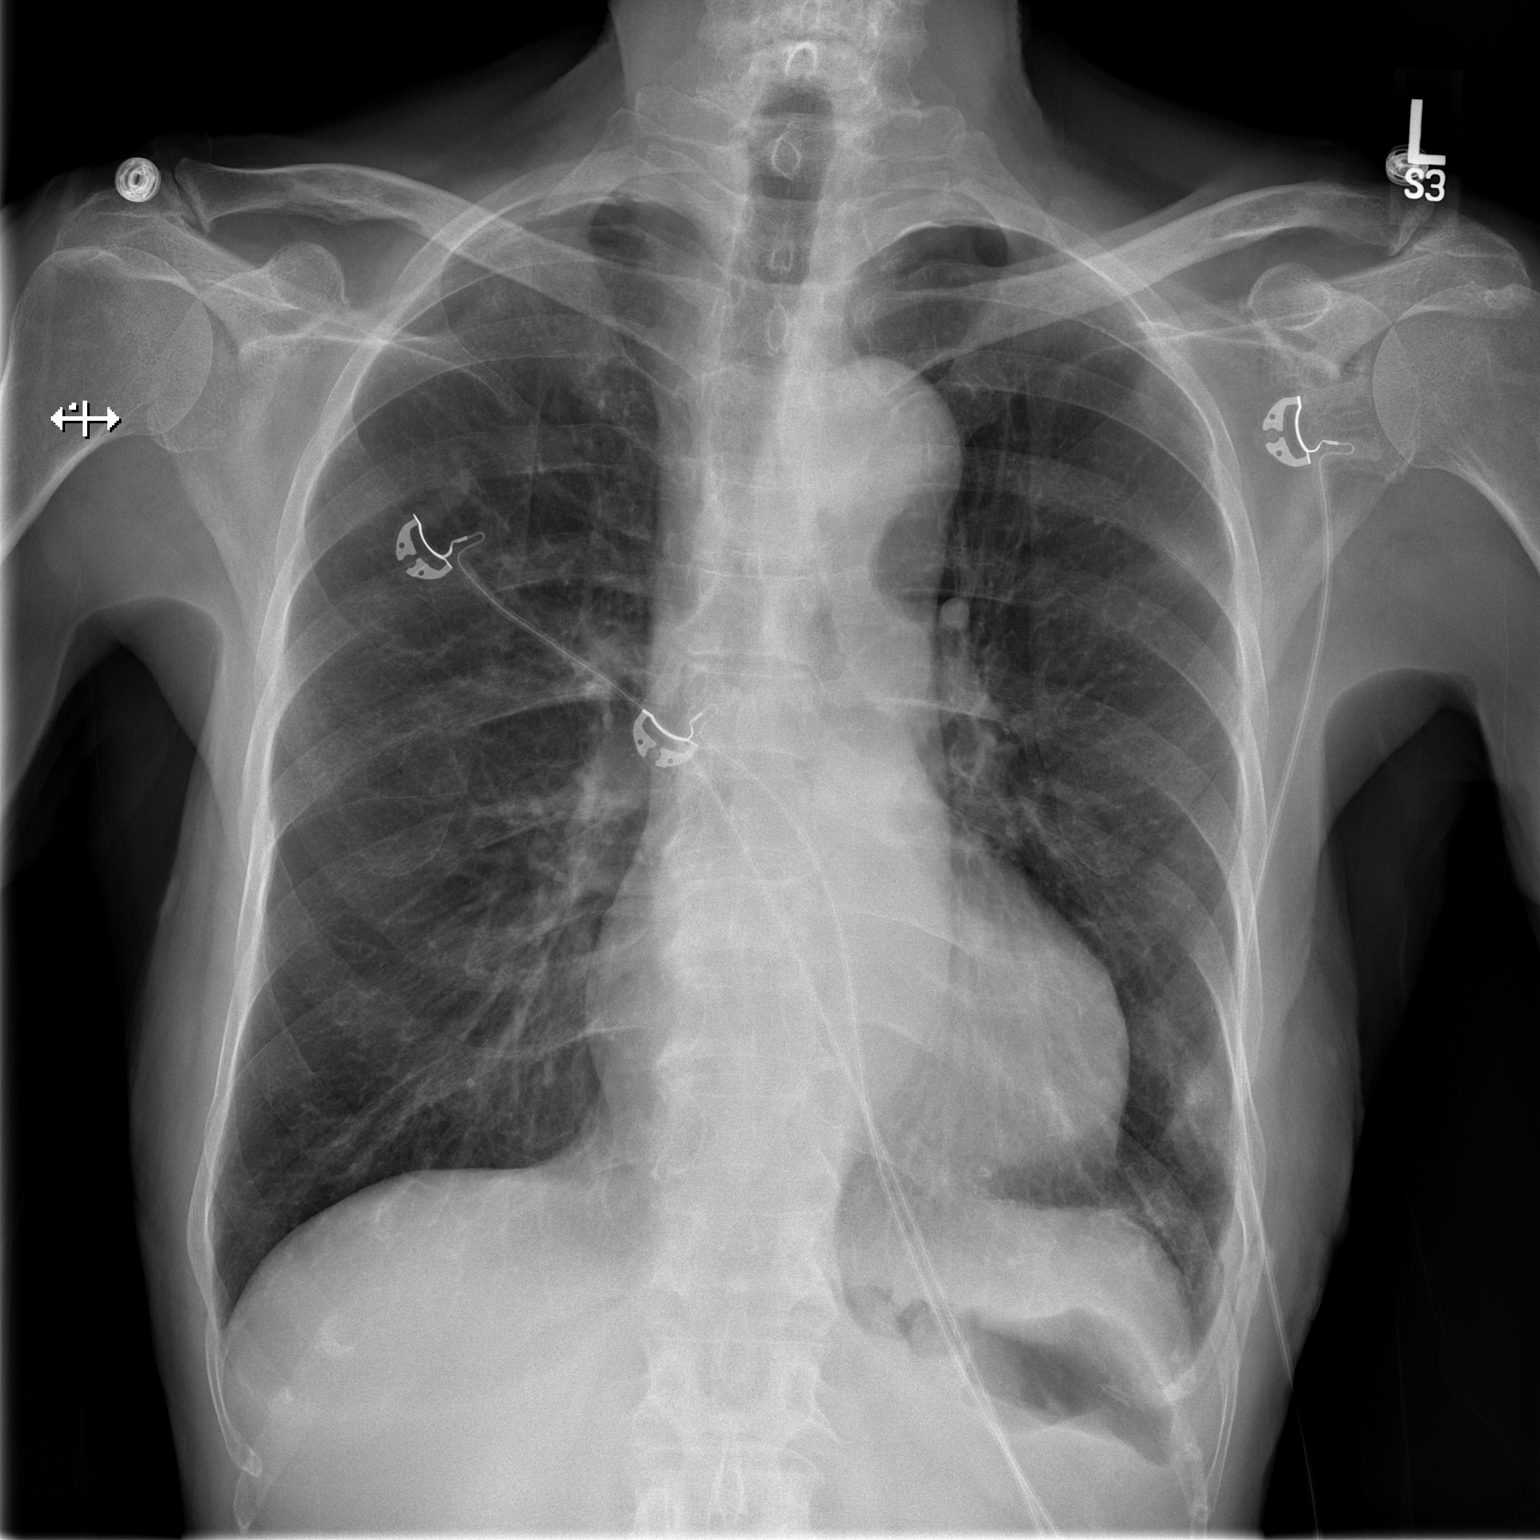

[w chest lat]
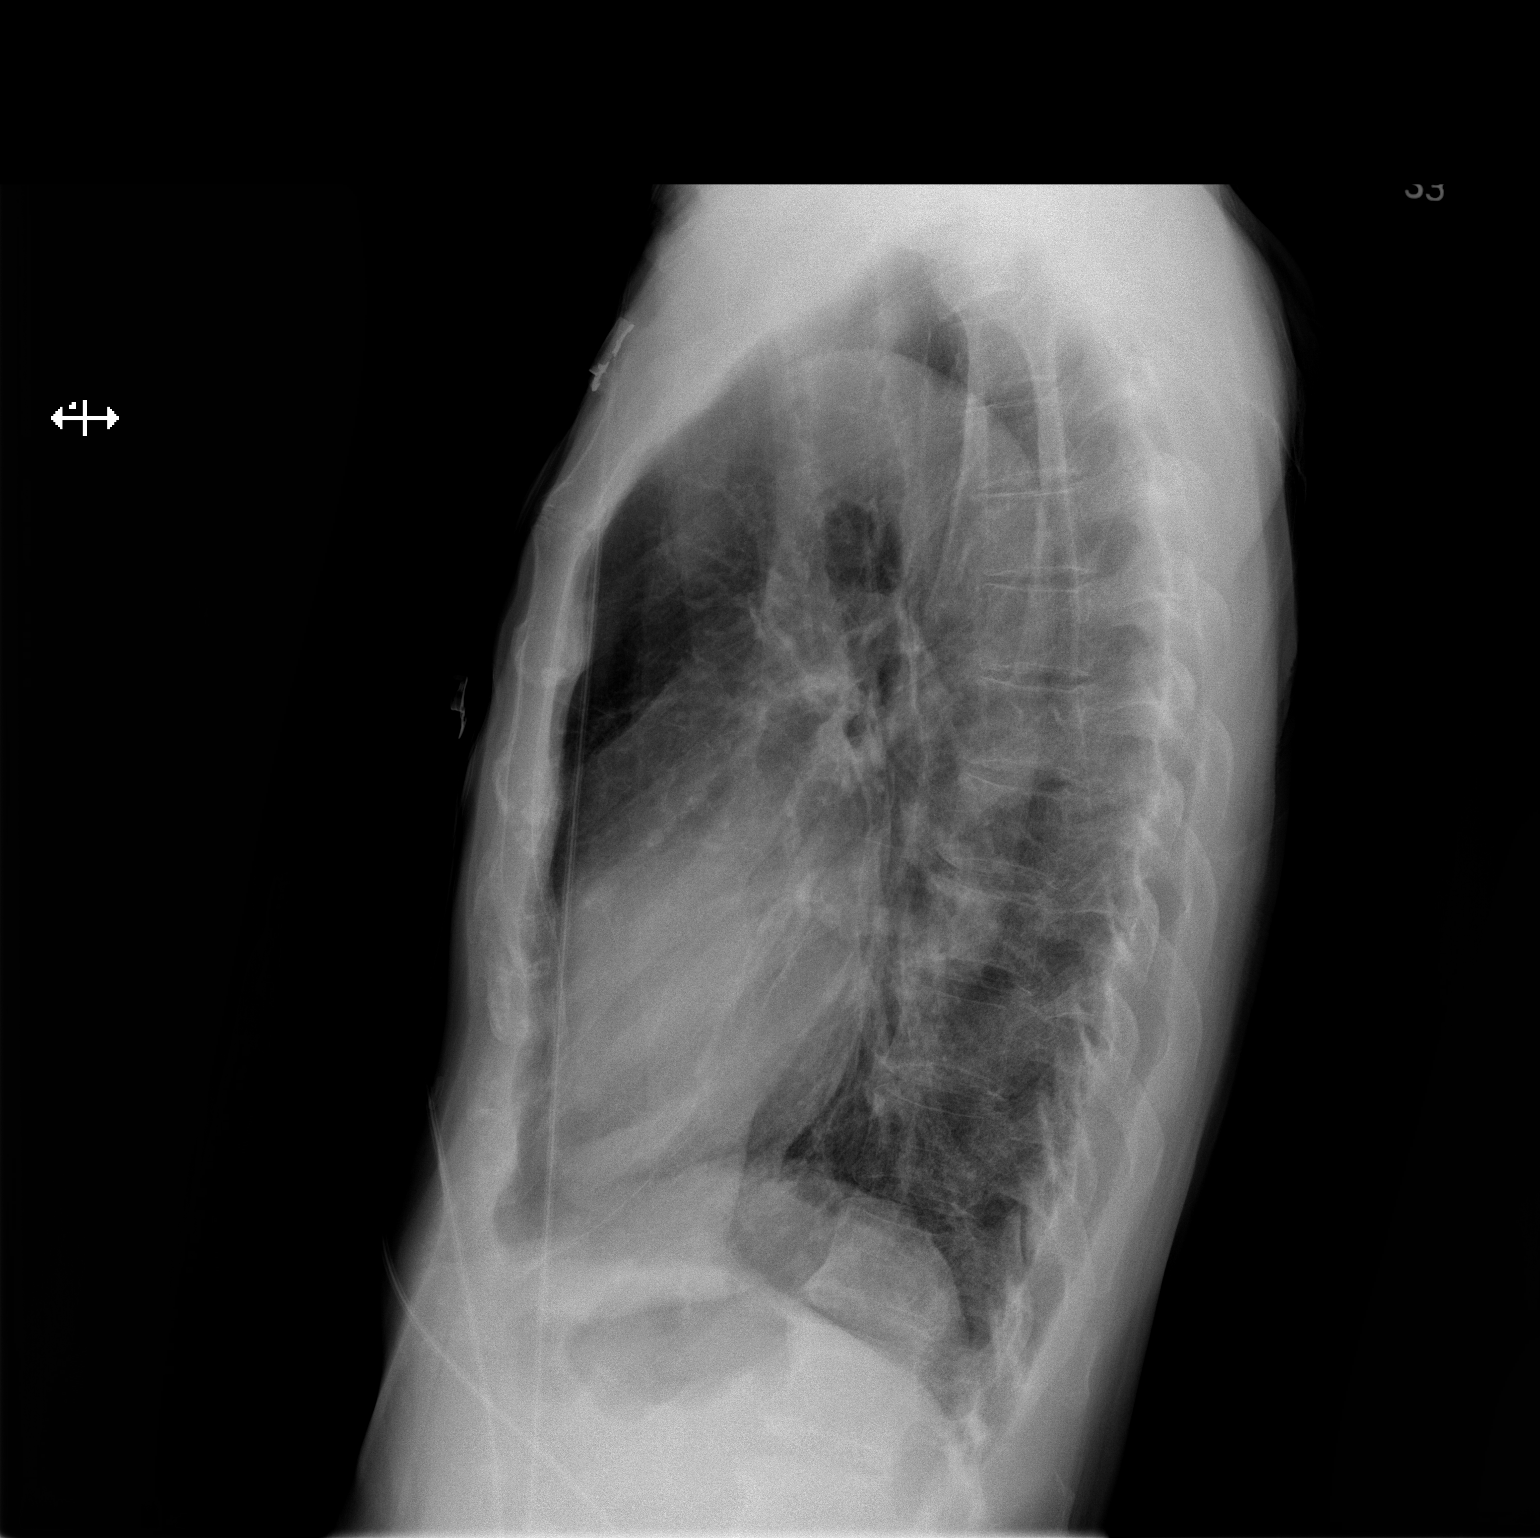

[2 of 2 positions shown; findings below may reference images not displayed]

FINDINGS: Several healing left lower rib fractures are identified
which shows some callus formation.  No evidence of pneumothorax or
hemothorax.

Both lungs are clear.  Mild cardiomegaly and ectasia of the
thoracic aorta noted.  No mass or lymphadenopathy identified.
IMPRESSION: Several subacute, healing left lower rib fractures.  No evidence of
hemopneumothorax or active lung disease.

## 2013-06-01 ENCOUNTER — Telehealth: Payer: Self-pay | Admitting: Internal Medicine

## 2013-06-01 MED ORDER — CEFUROXIME AXETIL 250 MG PO TABS: 250.0000 mg | ORAL_TABLET | Freq: Two times a day (BID) | ORAL | Status: DC

## 2013-06-01 NOTE — Telephone Encounter (Signed)
Called the patient/family to inform, but no answer and no voice mail set up.  Called the patients Hospice RN Chong Sicilian at [phone] to inform and stated would take care of notifying the family.

## 2013-06-01 NOTE — Telephone Encounter (Signed)
Received outside lab results; pt with GNR - id not done but resist to ampicillin, cefazolin, intermed only to levaquin and augmentin  Allergy to sulfa pm  OK for ceftin 250 bid   Robin to inform pt/family; I did rx erx

## 2013-06-04 ENCOUNTER — Other Ambulatory Visit: Payer: Self-pay | Admitting: Internal Medicine

## 2013-06-15 ENCOUNTER — Telehealth: Payer: Self-pay

## 2013-06-15 NOTE — Telephone Encounter (Signed)
Noted, thanks!

## 2013-06-15 NOTE — Telephone Encounter (Signed)
Hospice called to inform yesterday the patient was atrial fibrillation and HR of 190.  Hospice stayed with the patient for several hours and he converted back to normal sinus rhythm.  Hospice has started this patient on Atenolol 50 mg.  Heart rate today was 70.

## 2013-07-05 ENCOUNTER — Encounter (HOSPITAL_COMMUNITY): Payer: Self-pay | Admitting: Emergency Medicine

## 2013-07-05 ENCOUNTER — Emergency Department (HOSPITAL_COMMUNITY)
Admission: EM | Admit: 2013-07-05 | Discharge: 2013-07-05 | Disposition: A | Discharge status: Home / Self Care | Attending: Emergency Medicine | Admitting: Emergency Medicine

## 2013-07-05 DIAGNOSIS — Z8546 Personal history of malignant neoplasm of prostate: Secondary | ICD-10-CM | POA: Insufficient documentation

## 2013-07-05 DIAGNOSIS — R319 Hematuria, unspecified: Secondary | ICD-10-CM | POA: Insufficient documentation

## 2013-07-05 DIAGNOSIS — H543 Unqualified visual loss, both eyes: Secondary | ICD-10-CM | POA: Diagnosis not present

## 2013-07-05 DIAGNOSIS — Z862 Personal history of diseases of the blood and blood-forming organs and certain disorders involving the immune mechanism: Secondary | ICD-10-CM | POA: Diagnosis not present

## 2013-07-05 DIAGNOSIS — Z792 Long term (current) use of antibiotics: Secondary | ICD-10-CM | POA: Insufficient documentation

## 2013-07-05 DIAGNOSIS — Z8719 Personal history of other diseases of the digestive system: Secondary | ICD-10-CM | POA: Insufficient documentation

## 2013-07-05 DIAGNOSIS — Z79899 Other long term (current) drug therapy: Secondary | ICD-10-CM | POA: Insufficient documentation

## 2013-07-05 DIAGNOSIS — Z85118 Personal history of other malignant neoplasm of bronchus and lung: Secondary | ICD-10-CM | POA: Diagnosis not present

## 2013-07-05 DIAGNOSIS — F329 Major depressive disorder, single episode, unspecified: Secondary | ICD-10-CM | POA: Diagnosis not present

## 2013-07-05 DIAGNOSIS — Z8709 Personal history of other diseases of the respiratory system: Secondary | ICD-10-CM | POA: Diagnosis not present

## 2013-07-05 DIAGNOSIS — R339 Retention of urine, unspecified: Secondary | ICD-10-CM

## 2013-07-05 DIAGNOSIS — Z85828 Personal history of other malignant neoplasm of skin: Secondary | ICD-10-CM | POA: Diagnosis not present

## 2013-07-05 DIAGNOSIS — I10 Essential (primary) hypertension: Secondary | ICD-10-CM | POA: Diagnosis not present

## 2013-07-05 DIAGNOSIS — F3289 Other specified depressive episodes: Secondary | ICD-10-CM | POA: Insufficient documentation

## 2013-07-05 LAB — URINALYSIS, ROUTINE W REFLEX MICROSCOPIC
Bilirubin Urine: NEGATIVE
Glucose, UA: NEGATIVE mg/dL
Ketones, ur: NEGATIVE mg/dL
Nitrite: NEGATIVE
Protein, ur: 100 mg/dL — AB
Specific Gravity, Urine: 1.006 (ref 1.005–1.030)
Urobilinogen, UA: 0.2 mg/dL (ref 0.0–1.0)
pH: 6 (ref 5.0–8.0)

## 2013-07-05 LAB — URINE MICROSCOPIC-ADD ON

## 2013-07-05 NOTE — ED Notes (Signed)
Attempted to irrigate foley with 129ml NS, no urine return.

## 2013-07-05 NOTE — ED Notes (Signed)
Per EMS, Pt from home, pt complaining that foley is clogged, that it stopped draining around 4pm. Pt c/o severe abdominal pain and swelling. Pt tried to wait around for hospice, pt has prostate cancer, but the process was too long. A&Ox4.

## 2013-07-05 NOTE — ED Notes (Signed)
Bed: JS97 Expected date:  Expected time:  Means of arrival:  Comments: EMS- urinary retention, foley, Hx of CA

## 2013-07-06 ENCOUNTER — Telehealth: Payer: Self-pay

## 2013-07-06 NOTE — Telephone Encounter (Signed)
Called HHRN left a detailed message of ok

## 2013-07-06 NOTE — Telephone Encounter (Signed)
Hospice to inform the patient was seen in the ED yesterday/ discharged with an antibiotic and celexa.  Informed to followup with PCP.  Hospice does follow this patient well and is a hardship for him to come in.  [place] [facility] to continue taking care of this patient or schedule appointment with PCP.

## 2013-07-06 NOTE — Telephone Encounter (Signed)
Ok to cont with hospice, thanks

## 2013-07-12 ENCOUNTER — Telehealth: Payer: Self-pay

## 2013-07-12 NOTE — Telephone Encounter (Signed)
[facility] nurse called and stated that pt will be going to [facility] today for shortstay management of sx.  Nurse states that she will be out of the office on 07/14/2013 and if you need to contact the hospice staff, call [phone]

## 2013-07-12 NOTE — Telephone Encounter (Signed)
noted 

## 2013-07-15 NOTE — ED Provider Notes (Signed)
CSN: [id number]     Arrival date & time 07/05/13  1837 History   First MD Initiated Contact with Patient 07/05/13 1850     Chief Complaint  Patient presents with  . Urinary Retention     (Consider location/radiation/quality/duration/timing/severity/associated sxs/prior Treatment) HPI  80 year old male with urinary retention. Patient has a Foley catheter, but thinks it's blocked. Stopped draining around 4 PM. Increasing lower abdominal pain and distention. Some blood noted in tubing/bag. No dizziness or lightheadedness. No n/v. Hx of prostate CA. No blood thinners.   Past Medical History  Diagnosis Date  . ALLERGIC RHINITIS 08/12/2006  . BLINDNESS, LEFT EYE 08/12/2006  . DEPRESSION 08/12/2006  . DIVERTICULOSIS, COLON 08/12/2006  . GERD 04/10/2010  . Headache(784.0) 08/12/2006  . HYPERTENSION 08/12/2006  . LUNG CANCER, HX OF 08/12/2006  . PALPITATIONS, RECURRENT 04/10/2010  . PROSTATE CANCER, HX OF 08/12/2006  . PROSTATE CANCER, METASTATIC 04/11/2010  . SINUSITIS- ACUTE-NOS 04/10/2010  . SKIN CANCER, HX OF 08/12/2006  . URINARY INCONTINENCE 08/12/2006  . Anemia 11/20/2010   Past Surgical History  Procedure Laterality Date  . Removal skin cancer      SHOULDER  . Cataract surgery      RIGHT  . Tonsillectomy     Family History  Problem Relation Age of Onset  . Lung cancer Mother    History  Substance Use Topics  . Smoking status: Never Smoker   . Smokeless tobacco: Not on file  . Alcohol Use: No    Review of Systems  All systems reviewed and negative, other than as noted in HPI.   Allergies  Sulfonamide derivatives  Home Medications   Prior to Admission medications   Medication Sig Start Date End Date Taking? Authorizing Provider  cefUROXime (CEFTIN) 250 MG tablet Take 1 tablet (250 mg total) by mouth 2 (two) times daily. 06/01/13   Biagio Borg, MD  ciprofloxacin (CIPRO) 250 MG tablet Take 1 tablet (250 mg total) by mouth 2 (two) times daily. 02/09/13   Biagio Borg, MD   citalopram (CELEXA) 10 MG tablet Take 1 tablet (10 mg total) by mouth daily. NEEDS OFFICE VISIT 05/11/12   Theda Sers, PA-C  diphenhydrAMINE (BENADRYL) 50 MG capsule Take 1 capsule (50 mg total) by mouth every 6 (six) hours as needed for itching or sleep (Give with 1 or 2 Tylenols if pt has not received Tylenol within 4 hours). 06/14/11   Theodis Blaze, MD  HYDROcodone-acetaminophen (NORCO/VICODIN) 5-325 MG per tablet Take 1 tablet by mouth every 4 (four) hours as needed.    Historical Provider, MD  oxybutynin (DITROPAN) 5 MG tablet Take 1 tablet (5 mg total) by mouth daily. 07/16/12   Biagio Borg, MD  oxybutynin (DITROPAN-XL) 5 MG 24 hr tablet Take 5 mg by mouth daily.    Historical Provider, MD  prochlorperazine (COMPAZINE) 10 MG tablet Take 10 mg by mouth every 6 (six) hours as needed. FOR NAUSEA AND VOMITING    Historical Provider, MD  senna (SENOKOT) 8.6 MG TABS Take 1 tablet (8.6 mg total) by mouth daily as needed. 06/14/11   Theodis Blaze, MD  simvastatin (ZOCOR) 40 MG tablet take 1 tablet by mouth once daily 07/13/12   Biagio Borg, MD  tamsulosin [facility]) 0.4 MG CAPS take 1 capsule by mouth once daily 03/30/12   Biagio Borg, MD   BP 137/66  Pulse 56  Temp(Src) 98.7 F (37.1 C) (Oral)  Resp 16  SpO2 97% Physical Exam  Nursing note and vitals reviewed. Constitutional: He appears well-developed and well-nourished. No distress.  HENT:  Head: Normocephalic and atraumatic.  Eyes: Conjunctivae are normal. Right eye exhibits no discharge. Left eye exhibits no discharge.  Neck: Neck supple.  Cardiovascular: Normal rate, regular rhythm and normal heart sounds.  Exam reveals no gallop and no friction rub.   No murmur heard. Pulmonary/Chest: Effort normal and breath sounds normal. No respiratory distress.  Abdominal: Soft. He exhibits no distension. There is no tenderness.  Genitourinary:  Foley to leg bag. Hematuria.   Musculoskeletal: He exhibits no edema and no tenderness.   Neurological: He is alert.  Skin: Skin is warm and dry.  Psychiatric: He has a normal mood and affect. His behavior is normal. Thought content normal.    ED Course  Procedures (including critical care time) Labs Review Labs Reviewed  URINALYSIS, ROUTINE W REFLEX MICROSCOPIC - Abnormal; Notable for the following:    APPearance CLOUDY (*)    Hgb urine dipstick LARGE (*)    Protein, ur 100 (*)    Leukocytes, UA MODERATE (*)    All other components within normal limits  URINE MICROSCOPIC-ADD ON - Abnormal; Notable for the following:    Bacteria, UA FEW (*)    Casts HYALINE CASTS (*)    All other components within normal limits    Imaging Review No results found.   EKG Interpretation None      MDM   Final diagnoses:  Urinary retention    89yM with urinary retention. Foley blocked. Attempted to irrigate and ultimately placed a new one. Appears to be functioning fine now.     Virgel Manifold, MD 07/15/13 [phone]

## 2013-09-18 ENCOUNTER — Emergency Department (HOSPITAL_COMMUNITY)
Admission: EM | Admit: 2013-09-18 | Discharge: 2013-09-19 | Disposition: A | Discharge status: Home / Self Care | Attending: Emergency Medicine | Admitting: Emergency Medicine

## 2013-09-18 ENCOUNTER — Encounter (HOSPITAL_COMMUNITY): Payer: Self-pay | Admitting: Emergency Medicine

## 2013-09-18 DIAGNOSIS — Y92009 Unspecified place in unspecified non-institutional (private) residence as the place of occurrence of the external cause: Secondary | ICD-10-CM | POA: Diagnosis not present

## 2013-09-18 DIAGNOSIS — Z85828 Personal history of other malignant neoplasm of skin: Secondary | ICD-10-CM | POA: Diagnosis not present

## 2013-09-18 DIAGNOSIS — H5440 Blindness, one eye, unspecified eye: Secondary | ICD-10-CM | POA: Diagnosis not present

## 2013-09-18 DIAGNOSIS — F3289 Other specified depressive episodes: Secondary | ICD-10-CM | POA: Insufficient documentation

## 2013-09-18 DIAGNOSIS — R404 Transient alteration of awareness: Secondary | ICD-10-CM | POA: Diagnosis present

## 2013-09-18 DIAGNOSIS — Y9389 Activity, other specified: Secondary | ICD-10-CM | POA: Insufficient documentation

## 2013-09-18 DIAGNOSIS — Z8719 Personal history of other diseases of the digestive system: Secondary | ICD-10-CM | POA: Insufficient documentation

## 2013-09-18 DIAGNOSIS — R32 Unspecified urinary incontinence: Secondary | ICD-10-CM | POA: Diagnosis not present

## 2013-09-18 DIAGNOSIS — W07XXXA Fall from chair, initial encounter: Secondary | ICD-10-CM | POA: Diagnosis not present

## 2013-09-18 DIAGNOSIS — Z79899 Other long term (current) drug therapy: Secondary | ICD-10-CM | POA: Insufficient documentation

## 2013-09-18 DIAGNOSIS — I10 Essential (primary) hypertension: Secondary | ICD-10-CM | POA: Insufficient documentation

## 2013-09-18 DIAGNOSIS — Z862 Personal history of diseases of the blood and blood-forming organs and certain disorders involving the immune mechanism: Secondary | ICD-10-CM | POA: Diagnosis not present

## 2013-09-18 DIAGNOSIS — Z8546 Personal history of malignant neoplasm of prostate: Secondary | ICD-10-CM | POA: Insufficient documentation

## 2013-09-18 DIAGNOSIS — Z043 Encounter for examination and observation following other accident: Secondary | ICD-10-CM | POA: Diagnosis not present

## 2013-09-18 DIAGNOSIS — N390 Urinary tract infection, site not specified: Secondary | ICD-10-CM | POA: Insufficient documentation

## 2013-09-18 DIAGNOSIS — Z792 Long term (current) use of antibiotics: Secondary | ICD-10-CM | POA: Insufficient documentation

## 2013-09-18 DIAGNOSIS — W19XXXA Unspecified fall, initial encounter: Secondary | ICD-10-CM

## 2013-09-18 DIAGNOSIS — F329 Major depressive disorder, single episode, unspecified: Secondary | ICD-10-CM | POA: Insufficient documentation

## 2013-09-18 DIAGNOSIS — Z85118 Personal history of other malignant neoplasm of bronchus and lung: Secondary | ICD-10-CM | POA: Insufficient documentation

## 2013-09-18 MED ORDER — SODIUM CHLORIDE 0.9 % IV SOLN
1000.0000 mL | INTRAVENOUS | Status: DC
  Administered 2013-09-19 (×2): 1000 mL via INTRAVENOUS

## 2013-09-18 MED ORDER — SODIUM CHLORIDE 0.9 % IV SOLN
1000.0000 mL | Freq: Once | INTRAVENOUS | Status: AC
  Administered 2013-09-19: 1000 mL via INTRAVENOUS

## 2013-09-18 NOTE — ED Notes (Signed)
Patient says he found hisself laying on the floor and do not know how he got there.

## 2013-09-18 NOTE — ED Notes (Signed)
Pt arrived via EMS from home, pt states he thinks he got up to go to bed @ 2130, awoke on the floor 2209. A & O, DNR, no obvious injuries per EMS. IV est L FA # 22

## 2013-09-18 NOTE — ED Provider Notes (Signed)
CSN: [id number]     Arrival date & time 09/18/13  2304 History   First MD Initiated Contact with Patient 09/18/13 2346     Chief Complaint  Patient presents with  . Loss of Consciousness  . Fall      HPI Patient states his had decreased oral intake over the past several weeks has had increased generalized weakness over the past several days.  He was Preparing for bed and sitting in a chair.  He is planning to take his socks off and then he found himself on the floor in front and to the left of the chair he was sitting in.  He denies headache.  He denies neck pain.  He denies weakness in his arms or legs.  He still complains of some generalized weakness which is the same as prior to the fall.  He denies fevers and chills.  He denies nausea vomiting.  Denies diarrhea.  Denies chest pain.  Denies abdominal discomfort or pain.  His had no recent change in his medications.  He is a chronic indwelling Foley catheter.  As reported this catheter was changed out yesterday for increased sediment.  As far as he knows he is not on antibiotics.   Past Medical History  Diagnosis Date  . ALLERGIC RHINITIS 08/12/2006  . BLINDNESS, LEFT EYE 08/12/2006  . DEPRESSION 08/12/2006  . DIVERTICULOSIS, COLON 08/12/2006  . GERD 04/10/2010  . Headache(784.0) 08/12/2006  . HYPERTENSION 08/12/2006  . LUNG CANCER, HX OF 08/12/2006  . PALPITATIONS, RECURRENT 04/10/2010  . PROSTATE CANCER, HX OF 08/12/2006  . PROSTATE CANCER, METASTATIC 04/11/2010  . SINUSITIS- ACUTE-NOS 04/10/2010  . SKIN CANCER, HX OF 08/12/2006  . URINARY INCONTINENCE 08/12/2006  . Anemia 11/20/2010   Past Surgical History  Procedure Laterality Date  . Removal skin cancer      SHOULDER  . Cataract surgery      RIGHT  . Tonsillectomy     Family History  Problem Relation Age of Onset  . Lung cancer Mother    History  Substance Use Topics  . Smoking status: Never Smoker   . Smokeless tobacco: Not on file  . Alcohol Use: No    Review of Systems   All other systems reviewed and are negative.     Allergies  Sulfonamide derivatives  Home Medications   Prior to Admission medications   Medication Sig Start Date End Date Taking? Authorizing Provider  cephALEXin (KEFLEX) 500 MG capsule Take 1 capsule (500 mg total) by mouth 4 (four) times daily. 09/19/13   Hoy Morn, MD  ciprofloxacin (CIPRO) 250 MG tablet Take 1 tablet (250 mg total) by mouth 2 (two) times daily. 02/09/13   Biagio Borg, MD  citalopram (CELEXA) 10 MG tablet Take 1 tablet (10 mg total) by mouth daily. NEEDS OFFICE VISIT 05/11/12   Theda Sers, PA-C  diphenhydrAMINE (BENADRYL) 50 MG capsule Take 1 capsule (50 mg total) by mouth every 6 (six) hours as needed for itching or sleep (Give with 1 or 2 Tylenols if pt has not received Tylenol within 4 hours). 06/14/11   Theodis Blaze, MD  HYDROcodone-acetaminophen (NORCO/VICODIN) 5-325 MG per tablet Take 1 tablet by mouth every 4 (four) hours as needed.    Historical Provider, MD  oxybutynin (DITROPAN) 5 MG tablet Take 1 tablet (5 mg total) by mouth daily. 07/16/12   Biagio Borg, MD  oxybutynin (DITROPAN-XL) 5 MG 24 hr tablet Take 5 mg by mouth daily.  Historical Provider, MD  prochlorperazine (COMPAZINE) 10 MG tablet Take 10 mg by mouth every 6 (six) hours as needed. FOR NAUSEA AND VOMITING    Historical Provider, MD  senna (SENOKOT) 8.6 MG TABS Take 1 tablet (8.6 mg total) by mouth daily as needed. 06/14/11   Theodis Blaze, MD  simvastatin (ZOCOR) 40 MG tablet take 1 tablet by mouth once daily 07/13/12   Biagio Borg, MD  tamsulosin [facility]) 0.4 MG CAPS take 1 capsule by mouth once daily 03/30/12   Biagio Borg, MD   BP 136/60  Pulse 65  Temp(Src) 98.3 F (36.8 C) (Oral)  Resp 14  SpO2 93% Physical Exam  Nursing note and vitals reviewed. Constitutional: He is oriented to person, place, and time. He appears well-developed and well-nourished.  HENT:  Head: Normocephalic and atraumatic.  Eyes: EOM are normal. Pupils  are equal, round, and reactive to light.  Neck: Normal range of motion.  C-spine nontender.  No cervical step-offs  Cardiovascular: Normal rate, regular rhythm, normal heart sounds and intact distal pulses.   Pulmonary/Chest: Effort normal and breath sounds normal. No respiratory distress.  Abdominal: Soft. He exhibits no distension. There is no tenderness.  Genitourinary:  Indwelling Foley catheter placed.  Increased sediment in the tubing.  No blood.  Musculoskeletal: Normal range of motion.  Neurological: He is alert and oriented to person, place, and time.  5/5 strength in major muscle groups of  bilateral upper and lower extremities. Speech normal. No facial asymetry.   Skin: Skin is warm and dry.  Psychiatric: He has a normal mood and affect. Judgment normal.    ED Course  Procedures (including critical care time) Labs Review Labs Reviewed  URINALYSIS, ROUTINE W REFLEX MICROSCOPIC - Abnormal; Notable for the following:    Color, Urine AMBER (*)    APPearance TURBID (*)    Hgb urine dipstick LARGE (*)    Ketones, ur 15 (*)    Protein, ur >300 (*)    Nitrite POSITIVE (*)    Leukocytes, UA LARGE (*)    All other components within normal limits  CBC - Abnormal; Notable for the following:    WBC 14.5 (*)    RBC 3.44 (*)    Hemoglobin 10.8 (*)    HCT 32.7 (*)    Platelets 113 (*)    All other components within normal limits  COMPREHENSIVE METABOLIC PANEL - Abnormal; Notable for the following:    Glucose, Bld 109 (*)    BUN 31 (*)    Creatinine, Ser 1.45 (*)    Albumin 3.0 (*)    AST 41 (*)    GFR calc non Af Amer 40 (*)    GFR calc Af Amer 46 (*)    Anion gap 16 (*)    All other components within normal limits  URINE MICROSCOPIC-ADD ON - Abnormal; Notable for the following:    Squamous Epithelial / LPF FEW (*)    Bacteria, UA MANY (*)    All other components within normal limits  URINE CULTURE  TROPONIN I    Imaging Review No results found.   EKG  Interpretation None      MDM   Final diagnoses:  Fall, initial encounter  Urinary tract infection without hematuria, site unspecified    Foley catheter will be changed out.  A urinalysis will be sent off the new urine sample obtained when the new catheter is placed.  Patient will be hydrated.  IV fluids given.  Labs pending.  Overall well-appearing.  No use of anticoagulants.  No signs of head trauma.  C-spine nontender.  Full range of motion of bilateral knees hips and ankles.  4:50 AM Patient given Rocephin in the emergency department for probable urinary tract infection.  New catheter placed.  Discharge home with antibiotics.  PCP followup.    Hoy Morn, MD 09/19/13 [phone]

## 2013-09-19 DIAGNOSIS — Z043 Encounter for examination and observation following other accident: Secondary | ICD-10-CM | POA: Diagnosis not present

## 2013-09-19 LAB — URINALYSIS, ROUTINE W REFLEX MICROSCOPIC
Bilirubin Urine: NEGATIVE
Glucose, UA: NEGATIVE mg/dL
Ketones, ur: 15 mg/dL — AB
Nitrite: POSITIVE — AB
Protein, ur: >300 mg/dL — AB
Specific Gravity, Urine: 1.021 (ref 1.005–1.030)
Urobilinogen, UA: 0.2 mg/dL (ref 0.0–1.0)
pH: 6.5 (ref 5.0–8.0)

## 2013-09-19 LAB — COMPREHENSIVE METABOLIC PANEL WITH GFR
ALT: 10 U/L (ref 0–53)
AST: 41 U/L — ABNORMAL HIGH (ref 0–37)
Albumin: 3 g/dL — ABNORMAL LOW (ref 3.5–5.2)
Alkaline Phosphatase: 64 U/L (ref 39–117)
Anion gap: 16 — ABNORMAL HIGH (ref 5–15)
BUN: 31 mg/dL — ABNORMAL HIGH (ref 6–23)
CO2: 22 meq/L (ref 19–32)
Calcium: 9.3 mg/dL (ref 8.4–10.5)
Chloride: 101 meq/L (ref 96–112)
Creatinine, Ser: 1.45 mg/dL — ABNORMAL HIGH (ref 0.50–1.35)
GFR calc Af Amer: 46 mL/min — ABNORMAL LOW
GFR calc non Af Amer: 40 mL/min — ABNORMAL LOW
Glucose, Bld: 109 mg/dL — ABNORMAL HIGH (ref 70–99)
Potassium: 4.1 meq/L (ref 3.7–5.3)
Sodium: 139 meq/L (ref 137–147)
Total Bilirubin: 0.6 mg/dL (ref 0.3–1.2)
Total Protein: 6.1 g/dL (ref 6.0–8.3)

## 2013-09-19 LAB — CBC
HCT: 32.7 % — ABNORMAL LOW (ref 39.0–52.0)
Hemoglobin: 10.8 g/dL — ABNORMAL LOW (ref 13.0–17.0)
MCH: 31.4 pg (ref 26.0–34.0)
MCHC: 33 g/dL (ref 30.0–36.0)
MCV: 95.1 fL (ref 78.0–100.0)
Platelets: 113 10*3/uL — ABNORMAL LOW (ref 150–400)
RBC: 3.44 MIL/uL — ABNORMAL LOW (ref 4.22–5.81)
RDW: 15 % (ref 11.5–15.5)
WBC: 14.5 10*3/uL — ABNORMAL HIGH (ref 4.0–10.5)

## 2013-09-19 LAB — URINE MICROSCOPIC-ADD ON

## 2013-09-19 LAB — TROPONIN I: Troponin I: <0.3 ng/mL

## 2013-09-19 MED ORDER — CEPHALEXIN 500 MG PO CAPS: 500.0000 mg | ORAL_CAPSULE | Freq: Four times a day (QID) | ORAL | Status: AC

## 2013-09-19 MED ORDER — DEXTROSE 5 % IV SOLN
1.0000 g | Freq: Once | INTRAVENOUS | Status: AC
  Administered 2013-09-19: 1 g via INTRAVENOUS
  Filled 2013-09-19: qty 10

## 2013-09-19 NOTE — ED Notes (Signed)
[facility] here to pick up patient 

## 2013-09-22 LAB — URINE CULTURE: Colony Count: >=100000

## 2013-09-23 ENCOUNTER — Telehealth (HOSPITAL_BASED_OUTPATIENT_CLINIC_OR_DEPARTMENT_OTHER): Payer: Self-pay | Admitting: Emergency Medicine

## 2013-09-23 NOTE — Progress Notes (Signed)
ED Antimicrobial Stewardship Positive Culture Follow Up   Warren Christensen is an 80 y.o. male who presented to [facility] on 09/18/2013 with a chief complaint of  Chief Complaint  Patient presents with  . Loss of Consciousness  . Fall    Recent Results (from the past 720 hour(s))  URINE CULTURE     Status: None   Collection Time    09/19/13  1:35 AM      Result Value Ref Range Status   Specimen Description URINE, CATHETERIZED   Final   Special Requests NONE   Final   Culture  Setup Time     Final   Value: 09/19/2013 13:53     Performed at [facility]     Final   Value: >=100,000 COLONIES/ML     Performed at [facility]   Culture     Final   Value: PROTEUS MIRABILIS     PSEUDOMONAS AERUGINOSA     Performed at [facility]   Report Status 09/22/2013 FINAL   Final   Organism ID, Bacteria PROTEUS MIRABILIS   Final   Organism ID, Bacteria PSEUDOMONAS AERUGINOSA   Final    [x]  Treated with cephalexin 500 mg PO 4 times daily but, organism resistant to prescribed antimicrobial   New antibiotic prescription: Stop cephalexin and start ciprofloxacin 250 mg PO BID x 14 days.  ED Provider: Michele Mcalpine, PA-C  Nicole Kindred, Gillian Shields 09/23/2013, 10:12 AM Infectious Diseases Pharmacist Phone# [phone]

## 2013-09-23 NOTE — Telephone Encounter (Signed)
Post ED Visit - Positive Culture Follow-up: Successful Patient Follow-Up  Culture assessed and recommendations reviewed by: []  Wes [place], Pharm.D., BCPS []  Heide Guile, Pharm.D., BCPS []  Alycia Rossetti, Pharm.D., BCPS []  Sanderson, Pharm.D., BCPS, AAHIVP []  Legrand Como, Pharm.D., BCPS, AAHIVP []  Hassie Bruce, Pharm.D. [x]  Milus Glazier, Pharm.D.  Positive urineculture  []  Patient discharged without antimicrobial prescription and treatment is now indicated [x]  Organism is resistant to prescribed ED discharge antimicrobial []  Patient with positive blood cultures  Changes discussed with ED provider: Michele Mcalpine, [place] New antibiotic prescription Cipor 250 mg PO BID x 14 days, stop Keflex     09/23/13 @ 1530 ; attempt to contact patient, no answer   Ernesta Amble 09/23/2013, 3:29 PM

## 2013-09-27 ENCOUNTER — Telehealth (HOSPITAL_BASED_OUTPATIENT_CLINIC_OR_DEPARTMENT_OTHER): Payer: Self-pay | Admitting: Emergency Medicine

## 2013-12-28 DEATH — deceased
